# Patient Record
Sex: Female | Born: 1945 | Race: White | Hispanic: No | Marital: Single | Smoking: Former smoker
Health system: Southern US, Community
[De-identification: ages and names within clinical notes are randomized; demographics above are authoritative.]

## PROBLEM LIST (undated history)

## (undated) DIAGNOSIS — E785 Hyperlipidemia, unspecified: Secondary | ICD-10-CM

## (undated) DIAGNOSIS — I10 Essential (primary) hypertension: Secondary | ICD-10-CM

## (undated) DIAGNOSIS — E079 Disorder of thyroid, unspecified: Secondary | ICD-10-CM

## (undated) HISTORY — DX: Hyperlipidemia, unspecified: E78.5

## (undated) HISTORY — DX: Essential (primary) hypertension: I10

## (undated) HISTORY — DX: Disorder of thyroid, unspecified: E07.9

## (undated) HISTORY — PX: TONSILLECTOMY: SUR1361

---

## 2000-05-20 HISTORY — PX: COLONOSCOPY: SHX174

## 2007-03-27 HISTORY — PX: OTHER SURGICAL HISTORY: SHX169

## 2007-05-28 ENCOUNTER — Ambulatory Visit: Payer: Self-pay | Admitting: General Surgery

## 2007-05-28 ENCOUNTER — Other Ambulatory Visit: Payer: Self-pay

## 2007-05-29 ENCOUNTER — Ambulatory Visit: Payer: Self-pay | Admitting: General Surgery

## 2008-03-26 HISTORY — PX: EYE SURGERY: SHX253

## 2008-03-30 ENCOUNTER — Ambulatory Visit: Payer: Self-pay | Admitting: Orthopedic Surgery

## 2009-05-06 ENCOUNTER — Ambulatory Visit: Payer: Self-pay | Admitting: Ophthalmology

## 2009-05-16 ENCOUNTER — Ambulatory Visit: Payer: Self-pay | Admitting: Ophthalmology

## 2009-07-08 ENCOUNTER — Ambulatory Visit: Payer: Self-pay | Admitting: Ophthalmology

## 2009-07-18 ENCOUNTER — Ambulatory Visit: Payer: Self-pay | Admitting: Ophthalmology

## 2011-05-24 HISTORY — PX: COLONOSCOPY: SHX174

## 2013-06-12 ENCOUNTER — Encounter: Payer: Self-pay | Admitting: Podiatry

## 2013-06-12 ENCOUNTER — Ambulatory Visit (INDEPENDENT_AMBULATORY_CARE_PROVIDER_SITE_OTHER): Payer: Medicare Other | Admitting: Podiatry

## 2013-06-12 VITALS — BP 122/66 | HR 72 | Resp 16 | Ht 62.0 in | Wt 135.0 lb

## 2013-06-12 DIAGNOSIS — M79609 Pain in unspecified limb: Secondary | ICD-10-CM

## 2013-06-12 DIAGNOSIS — B351 Tinea unguium: Secondary | ICD-10-CM

## 2013-06-12 NOTE — Progress Notes (Signed)
   Subjective:    Patient ID: Tammy Dalton, female    DOB: [birth year], 68 y.o.   MRN: [id number]  HPI Comments: Right great toenail is raised and yellow , sore on the lateral side of toe. Its been a couple of months with no treatment      Review of Systems  HENT:       Sinus   All other systems reviewed and are negative.       Objective:   Physical Exam        Assessment & Plan:

## 2013-06-12 NOTE — Progress Notes (Signed)
Subjective:     Patient ID: Tammy Dalton, female   DOB: [birth year], 68 y.o.   MRN: [id number]  HPI patient points to right big toe stating she's concerned by some discoloration and thickness   Review of Systems  All other systems reviewed and are negative.       Objective:   Physical Exam  Nursing note and vitals reviewed. Constitutional: She is oriented to person, place, and time.  Cardiovascular: Intact distal pulses.   Musculoskeletal: Normal range of motion.  Neurological: She is oriented to person, place, and time.  Skin: Skin is warm.   neurovascular status intact with range of motion adequate subtalar midtarsal joint and muscle strength adequate. Right hallux lateral side has some lifting and yellow  with minimal discomfort     Assessment:     Probable trauma to the right hallux nail with possible mycotic infiltration    Plan:     H&P reviewed and I have recommended topical medication and leading they grow out. If symptoms were to present and progress we will need to consider nail excision

## 2013-06-18 ENCOUNTER — Ambulatory Visit: Payer: Self-pay | Admitting: Podiatry

## 2014-07-20 ENCOUNTER — Ambulatory Visit (INDEPENDENT_AMBULATORY_CARE_PROVIDER_SITE_OTHER): Payer: Medicare Other | Admitting: Podiatry

## 2014-07-20 ENCOUNTER — Ambulatory Visit (INDEPENDENT_AMBULATORY_CARE_PROVIDER_SITE_OTHER): Payer: Medicare Other

## 2014-07-20 VITALS — BP 129/63 | HR 77 | Resp 16 | Ht 62.0 in | Wt 140.0 lb

## 2014-07-20 DIAGNOSIS — M2041 Other hammer toe(s) (acquired), right foot: Secondary | ICD-10-CM

## 2014-07-20 DIAGNOSIS — L84 Corns and callosities: Secondary | ICD-10-CM

## 2014-07-20 DIAGNOSIS — L603 Nail dystrophy: Secondary | ICD-10-CM | POA: Diagnosis not present

## 2014-07-22 NOTE — Progress Notes (Signed)
Patient ID: Tammy Dalton, female   DOB: [birth year], 69 y.o.   MRN: [id number]  Subjective: 69 year old female presents the office today with complaints of a painful corn on her right foot between her fourth and fifth toes. She states the areas painful particularly with pressure in certain shoes. She denies any redness or drainage from the area. She also states that her right big toe has some discoloration and she has tried topical medication without much resolution. Denies any swelling or redness from toenail denies any pain to the toenail. Denies any systemic complaints such as fevers, chills, nausea, vomiting. No acute changes since last appointment, and no other complaints at this time.   Objective: AAO x3, NAD DP/PT pulses palpable bilaterally, CRT less than 3 seconds Protective sensation intact with Simms Weinstein monofilament, vibratory sensation intact, Achilles tendon reflex intact On the lateral aspect the right fourth digit there is a small hyperkeratotic lesion over the PIPJ. Upon debridement lesion there is no underlying ulceration, drainage or other clinical signs of infection. 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 Right distal hallux nail does appear to be dystrophic, discolored and mildly hypertrophic. There is no tenderness to palpation overlying the nail and there is no swelling erythema or drainage. Remaining nails without pathology. No areas of pinpoint bony tenderness or pain with vibratory sensation. MMT 5/5, ROM WNL. No edema, erythema, increase in warmth to bilateral lower extremities.  No open lesions or pre-ulcerative lesions.  No pain with calf compression, swelling, warmth, erythema  Assessment: 69 year old female hyperkeratotic lesion right fourth toe, right fourth toenail likely onychomycosis  Plan: -All treatment options discussed with the patient including all alternatives, risks, complications.   -Hyperkeratotic lesions are debrided without occasions/clean. Dispensed offloading pads. Discussed shoe gear modifications. -In regards the right hallux toenail discussed slightly mycotic infection. Since she has attempted topical treatment without much resolution the nail was biopsied and sent to [facility] labs for evaluation.  discussed various treatment options for onychomycosis however we will await the results of the biopsy before proceeding with further treatment.  -Follow-up after nail biopsy results are obtained or sooner if any problems are to arise.  -Patient encouraged to call the office with any questions, concerns, change in symptoms.

## 2014-07-29 ENCOUNTER — Telehealth: Payer: Self-pay | Admitting: Podiatry

## 2014-07-29 NOTE — Telephone Encounter (Signed)
Patient is scheduled with dr Jacqualyn Posey for tues may 10th

## 2014-07-29 NOTE — Telephone Encounter (Signed)
Please call about her blood work

## 2014-08-03 ENCOUNTER — Ambulatory Visit (INDEPENDENT_AMBULATORY_CARE_PROVIDER_SITE_OTHER): Payer: Medicare Other | Admitting: Podiatry

## 2014-08-03 DIAGNOSIS — B351 Tinea unguium: Secondary | ICD-10-CM

## 2014-08-03 MED ORDER — CICLOPIROX 8 % EX SOLN: Freq: Every day | CUTANEOUS | Status: DC

## 2014-08-06 ENCOUNTER — Telehealth: Payer: Self-pay | Admitting: *Deleted

## 2014-08-06 NOTE — Telephone Encounter (Signed)
Called prescription penlac into [facility] [address], patient called and stated that she had lost her prescription

## 2014-08-06 NOTE — Progress Notes (Signed)
Patient ID: Tammy Dalton, female   DOB: 17-Jan-1946, 69 y.o.   MRN: [id number]  Subjective: 69 year old female presents the office to discuss nail biopsy results. She denies any acute changes since last appointment. She states that she continues not have any pain on the toenail denies any redness or drainage. No other complaints at this time in no acute changes since last appointment.  Objective: AAO 3, NAD Neurovascular status unchanged. Right hallux nail does appear to be dystrophic, discolored and mildly hypertrophic as well as the fourth digit toenail the right foot. Remaining nails also has slight discoloration. There is no tenderness to palpation along the nails is no surrounding erythema or drainage. No other areas of tenderness to bilateral  Lower extremity's. No pain with calf compression, swelling, warmth, erythema.  Assessment: 69 year old female with onychomycosis.  Plan: -Treatment options were discussed include alternatives, risks, complications. Nail biopsy results were discussed the patient which revealed Saprophytic fungil.  At this time she is not to proceed with topical treatment and Penlac was prescribed no known may not be the best treatment for this particular fungus. Application instructions as well as side effects were discussed the patient. Follow-up as needed. Call the office with any questions, concerns, changes symptoms.

## 2016-04-18 ENCOUNTER — Encounter: Payer: Self-pay | Admitting: *Deleted

## 2016-04-19 ENCOUNTER — Ambulatory Visit (INDEPENDENT_AMBULATORY_CARE_PROVIDER_SITE_OTHER): Payer: Medicare Other | Admitting: General Surgery

## 2016-04-19 ENCOUNTER — Encounter: Payer: Self-pay | Admitting: General Surgery

## 2016-04-19 VITALS — BP 130/74 | HR 74 | Resp 12 | Ht 62.0 in | Wt 139.0 lb

## 2016-04-19 DIAGNOSIS — K625 Hemorrhage of anus and rectum: Secondary | ICD-10-CM | POA: Diagnosis not present

## 2016-04-19 LAB — POC HEMOCCULT BLD/STL (OFFICE/1-CARD/DIAGNOSTIC): FECAL OCCULT BLD: POSITIVE — AB

## 2016-04-19 MED ORDER — POLYETHYLENE GLYCOL 3350 17 GM/SCOOP PO POWD: ORAL | 0 refills | Status: DC

## 2016-04-19 NOTE — Patient Instructions (Signed)
Call back with any questions or concerns. Colonoscopy, Adult A colonoscopy is an exam to look at the entire large intestine. During the exam, a lubricated, bendable tube is inserted into the anus and then passed into the rectum, colon, and other parts of the large intestine. A colonoscopy is often done as a part of normal colorectal screening or in response to certain symptoms, such as anemia, persistent diarrhea, abdominal pain, and blood in the stool. The exam can help screen for and diagnose medical problems, including:  Tumors.  Polyps.  Inflammation.  Areas of bleeding. Tell a health care provider about:  Any allergies you have.  All medicines you are taking, including vitamins, herbs, eye drops, creams, and over-the-counter medicines.  Any problems you or family members have had with anesthetic medicines.  Any blood disorders you have.  Any surgeries you have had.  Any medical conditions you have.  Any problems you have had passing stool. What are the risks? Generally, this is a safe procedure. However, problems may occur, including:  Bleeding.  A tear in the intestine.  A reaction to medicines given during the exam.  Infection (rare). What happens before the procedure? Eating and drinking restrictions  Follow instructions from your health care provider about eating and drinking, which may include:  A few days before the procedure - follow a low-fiber diet. Avoid nuts, seeds, dried fruit, raw fruits, and vegetables.  1-3 days before the procedure - follow a clear liquid diet. Drink only clear liquids, such as clear broth or bouillon, black coffee or tea, clear juice, clear soft drinks or sports drinks, gelatin desert, and popsicles. Avoid any liquids that contain red or purple dye.  On the day of the procedure - do not eat or drink anything during the 2 hours before the procedure, or within the time period that your health care provider recommends. Bowel prep  If  you were prescribed an oral bowel prep to clean out your colon:  Take it as told by your health care provider. Starting the day before your procedure, you will need to drink a large amount of medicated liquid. The liquid will cause you to have multiple loose stools until your stool is almost clear or light green.  If your skin or anus gets irritated from diarrhea, you may use these to relieve the irritation:  Medicated wipes, such as adult wet wipes with aloe and vitamin E.  A skin soothing-product like petroleum jelly.  If you vomit while drinking the bowel prep, take a break for up to 60 minutes and then begin the bowel prep again. If vomiting continues and you cannot take the bowel prep without vomiting, call your health care provider. General instructions  Ask your health care provider about changing or stopping your regular medicines. This is especially important if you are taking diabetes medicines or blood thinners.  Plan to have someone take you home from the hospital or clinic. What happens during the procedure?  An IV tube may be inserted into one of your veins.  You will be given medicine to help you relax (sedative).  To reduce your risk of infection:  Your health care team will wash or sanitize their hands.  Your anal area will be washed with soap.  You will be asked to lie on your side with your knees bent.  Your health care provider will lubricate a long, thin, flexible tube. The tube will have a camera and a light on the end.  The tube  will be inserted into your anus.  The tube will be gently eased through your rectum and colon.  Air will be delivered into your colon to keep it open. You may feel some pressure or cramping.  The camera will be used to take images during the procedure.  A small tissue sample may be removed from your body to be examined under a microscope (biopsy). If any potential problems are found, the tissue will be sent to a lab for  testing.  If small polyps are found, your health care provider may remove them and have them checked for cancer cells.  The tube that was inserted into your anus will be slowly removed. The procedure may vary among health care providers and hospitals. What happens after the procedure?  Your blood pressure, heart rate, breathing rate, and blood oxygen level will be monitored until the medicines you were given have worn off.  Do not drive for 24 hours after the exam.  You may have a small amount of blood in your stool.  You may pass gas and have mild abdominal cramping or bloating due to the air that was used to inflate your colon during the exam.  It is up to you to get the results of your procedure. Ask your health care provider, or the department performing the procedure, when your results will be ready. This information is not intended to replace advice given to you by your health care provider. Make sure you discuss any questions you have with your health care provider. Document Released: 03/09/2000 Document Revised: 09/30/2015 Document Reviewed: 05/24/2015 Elsevier Interactive Patient Education  2017 [employer].

## 2016-04-19 NOTE — Progress Notes (Signed)
Patient ID: Tammy Dalton, female   DOB: 09-10-1945, 71 y.o.   MRN: [id number]  Chief Complaint  Patient presents with  . Other    HPI Tammy Dalton is a 71 y.o. female here today for evaluation of rectal bleeding. She noticed rectal blooding on Tuesday . Bright red blood in the bowel and on paper. Noted that she had one episode of diarrhea on Monday. Denies pain, fever, N/V.  I have reviewed the history of present illness with the patient.    HPI  Past Medical History:  Diagnosis Date  . Hyperlipidemia   . Hypertension   . Thyroid disease     Past Surgical History:  Procedure Laterality Date  . ANAL FISTULOTOMY  2009  . COLONOSCOPY  05/24/2011   Dr. Jill Side done at [facility]  . COLONOSCOPY  05/20/2000   Dr. Jamal Collin; Sharp bend at 50 cm. Scope could not be advanced any further even with glucagon.  . TONSILLECTOMY      No family history on file.  Social History Social History  Substance Use Topics  . Smoking status: Former Smoker    Types: Cigarettes  . Smokeless tobacco: Never Used  . Alcohol use No    No Known Allergies  Current Outpatient Prescriptions  Medication Sig Dispense Refill  . cholecalciferol (VITAMIN D) 1000 units tablet Take 1,000 Units by mouth daily.    Marland Kitchen levothyroxine (SYNTHROID, LEVOTHROID) 112 MCG tablet     . lisinopril-hydrochlorothiazide (PRINZIDE,ZESTORETIC) 20-25 MG per tablet     . Omega-3 Fatty Acids (FISH OIL) 1200 MG CAPS Take by mouth.    . simvastatin (ZOCOR) 40 MG tablet     . zolpidem (AMBIEN) 10 MG tablet     . polyethylene glycol powder (GLYCOLAX/MIRALAX) powder 255 grams one bottle for colonoscopy prep 255 g 0   No current facility-administered medications for this visit.     Review of Systems Review of Systems  Blood pressure 130/74, pulse 74, resp. rate 12, height 5\' 2"  (1.575 m), weight 139 lb (63 kg).  Physical Exam Physical Exam  Constitutional: She is oriented to person, place,  and time. She appears well-developed and well-nourished.  Eyes: Conjunctivae are normal. No scleral icterus.  Neck: Neck supple.  Cardiovascular: Normal rate, regular rhythm and normal heart sounds.   Pulmonary/Chest: Effort normal and breath sounds normal.  Abdominal: Soft. Bowel sounds are normal. There is no tenderness.  Genitourinary: Rectum normal.     Lymphadenopathy:    She has no cervical adenopathy.  Neurological: She is alert and oriented to person, place, and time.  Skin: Skin is warm and dry.  Psychiatric: She has a normal mood and affect.    Data Reviewed Prior notes reviewed.  Assessment    Rectal bleeding-possibly diverticular    Plan    Schedule colonoscopy.  Colonoscopy with possible biopsy/polypectomy prn: Information regarding the procedure, including its potential risks and complications (including but not limited to perforation of the bowel, which may require emergency surgery to repair, and bleeding) was verbally given to the patient. Educational information regarding lower intestinal endoscopy was given to the patient. Written instructions for how to complete the bowel prep using Miralax were provided. The importance of drinking ample fluids to avoid dehydration as a result of the prep emphasized.    Patient has been scheduled for a colonoscopy on 04-24-16 at [facility]. This patient is aware to go ahead and discontinue her fish oil.  This information has been scribed by  Gaspar Cola CMA.   SANKAR,SEEPLAPUTHUR G 04/23/2016, 8:37 AM

## 2016-04-23 ENCOUNTER — Encounter: Payer: Self-pay | Admitting: General Surgery

## 2016-04-24 ENCOUNTER — Ambulatory Visit: Payer: Medicare Other | Admitting: Anesthesiology

## 2016-04-24 ENCOUNTER — Encounter: Payer: Self-pay | Admitting: *Deleted

## 2016-04-24 ENCOUNTER — Ambulatory Visit
Admission: RE | Admit: 2016-04-24 | Discharge: 2016-04-24 | Disposition: A | Discharge status: Home / Self Care | Payer: Medicare Other | Source: Ambulatory Visit | Attending: General Surgery | Admitting: General Surgery

## 2016-04-24 ENCOUNTER — Encounter
Admission: RE | Disposition: A | Discharge status: Home / Self Care | Payer: Self-pay | Source: Ambulatory Visit | Attending: General Surgery

## 2016-04-24 DIAGNOSIS — Z79899 Other long term (current) drug therapy: Secondary | ICD-10-CM | POA: Diagnosis not present

## 2016-04-24 DIAGNOSIS — I10 Essential (primary) hypertension: Secondary | ICD-10-CM | POA: Diagnosis not present

## 2016-04-24 DIAGNOSIS — Z87891 Personal history of nicotine dependence: Secondary | ICD-10-CM | POA: Insufficient documentation

## 2016-04-24 DIAGNOSIS — E039 Hypothyroidism, unspecified: Secondary | ICD-10-CM | POA: Insufficient documentation

## 2016-04-24 DIAGNOSIS — K625 Hemorrhage of anus and rectum: Secondary | ICD-10-CM | POA: Diagnosis not present

## 2016-04-24 DIAGNOSIS — E785 Hyperlipidemia, unspecified: Secondary | ICD-10-CM | POA: Diagnosis not present

## 2016-04-24 HISTORY — PX: COLONOSCOPY WITH PROPOFOL: SHX5780

## 2016-04-24 SURGERY — COLONOSCOPY WITH PROPOFOL
Anesthesia: General

## 2016-04-24 MED ORDER — LIDOCAINE HCL (PF) 2 % IJ SOLN
INTRAMUSCULAR | Status: AC
  Filled 2016-04-24: qty 2

## 2016-04-24 MED ORDER — LIDOCAINE HCL (CARDIAC) 20 MG/ML IV SOLN
INTRAVENOUS | Status: DC | PRN
  Administered 2016-04-24: 60 mg via INTRAVENOUS

## 2016-04-24 MED ORDER — PROPOFOL 10 MG/ML IV BOLUS
INTRAVENOUS | Status: DC | PRN
  Administered 2016-04-24: 50 mg via INTRAVENOUS

## 2016-04-24 MED ORDER — PROPOFOL 500 MG/50ML IV EMUL
INTRAVENOUS | Status: AC
  Filled 2016-04-24: qty 50

## 2016-04-24 MED ORDER — PROPOFOL 500 MG/50ML IV EMUL
INTRAVENOUS | Status: DC | PRN
  Administered 2016-04-24: 150 ug/kg/min via INTRAVENOUS

## 2016-04-24 MED ORDER — SODIUM CHLORIDE 0.9 % IV SOLN
INTRAVENOUS | Status: DC | PRN
  Administered 2016-04-24: 16:00:00 via INTRAVENOUS

## 2016-04-24 NOTE — Anesthesia Preprocedure Evaluation (Signed)
Anesthesia Evaluation  Patient identified by MRN, date of birth, ID band Patient awake    Reviewed: Allergy & Precautions, NPO status , Patient's Chart, lab work & pertinent test results  History of Anesthesia Complications Negative for: history of anesthetic complications  Airway Mallampati: III       Dental   Pulmonary neg pulmonary ROS, former smoker,           Cardiovascular hypertension, Pt. on medications      Neuro/Psych negative neurological ROS     GI/Hepatic negative GI ROS, Neg liver ROS,   Endo/Other  Hypothyroidism   Renal/GU negative Renal ROS     Musculoskeletal   Abdominal   Peds  Hematology negative hematology ROS (+)   Anesthesia Other Findings   Reproductive/Obstetrics                             Anesthesia Physical Anesthesia Plan  ASA: II  Anesthesia Plan: General   Post-op Pain Management:    Induction: Intravenous  Airway Management Planned: Nasal Cannula  Additional Equipment:   Intra-op Plan:   Post-operative Plan:   Informed Consent: I have reviewed the patients History and Physical, chart, labs and discussed the procedure including the risks, benefits and alternatives for the proposed anesthesia with the patient or authorized representative who has indicated his/her understanding and acceptance.     Plan Discussed with:   Anesthesia Plan Comments:         Anesthesia Quick Evaluation

## 2016-04-24 NOTE — Interval H&P Note (Signed)
History and Physical Interval Note:  04/24/2016 3:32 PM  Golden Pop  has presented today for surgery, with the diagnosis of RECTAL BLEED  The various methods of treatment have been discussed with the patient and family. After consideration of risks, benefits and other options for treatment, the patient has consented to  Procedure(s): COLONOSCOPY WITH PROPOFOL (N/A) as a surgical intervention .  The patient's history has been reviewed, patient examined, no change in status, stable for surgery.  I have reviewed the patient's chart and labs.  Questions were answered to the patient's satisfaction.     SANKAR,SEEPLAPUTHUR G

## 2016-04-24 NOTE — Anesthesia Postprocedure Evaluation (Signed)
Anesthesia Post Note  Patient: Tammy Dalton  Procedure(s) Performed: Procedure(s) (LRB): COLONOSCOPY WITH PROPOFOL (N/A)  Patient location during evaluation: Endoscopy Anesthesia Type: General Level of consciousness: awake and alert Pain management: pain level controlled Vital Signs Assessment: post-procedure vital signs reviewed and stable Respiratory status: spontaneous breathing and respiratory function stable Cardiovascular status: stable Anesthetic complications: no     Last Vitals:  Vitals:   04/24/16 1505 04/24/16 1650  BP: 122/63 120/66  Pulse: 82   Resp: 16 16  Temp: 36.3 C (!) 35.7 C    Last Pain:  Vitals:   04/24/16 1650  TempSrc: Tympanic                 KEPHART,WILLIAM K

## 2016-04-24 NOTE — H&P (View-Only) (Signed)
Patient ID: Tammy Dalton, female   DOB: [birth year], 71 y.o.   MRN: [id number]  Chief Complaint  Patient presents with  . Other    HPI Tammy Dalton is a 71 y.o. female here today for evaluation of rectal bleeding. She noticed rectal blooding on Tuesday . Bright red blood in the bowel and on paper. Noted that she had one episode of diarrhea on Monday. Denies pain, fever, N/V.  I have reviewed the history of present illness with the patient.    HPI  Past Medical History:  Diagnosis Date  . Hyperlipidemia   . Hypertension   . Thyroid disease     Past Surgical History:  Procedure Laterality Date  . ANAL FISTULOTOMY  2009  . COLONOSCOPY  05/24/2011   Dr. Jill Side done at [facility]  . COLONOSCOPY  05/20/2000   Dr. Jamal Collin; Sharp bend at 50 cm. Scope could not be advanced any further even with glucagon.  . TONSILLECTOMY      No family history on file.  Social History Social History  Substance Use Topics  . Smoking status: Former Smoker    Types: Cigarettes  . Smokeless tobacco: Never Used  . Alcohol use No    No Known Allergies  Current Outpatient Prescriptions  Medication Sig Dispense Refill  . cholecalciferol (VITAMIN D) 1000 units tablet Take 1,000 Units by mouth daily.    Marland Kitchen levothyroxine (SYNTHROID, LEVOTHROID) 112 MCG tablet     . lisinopril-hydrochlorothiazide (PRINZIDE,ZESTORETIC) 20-25 MG per tablet     . Omega-3 Fatty Acids (FISH OIL) 1200 MG CAPS Take by mouth.    . simvastatin (ZOCOR) 40 MG tablet     . zolpidem (AMBIEN) 10 MG tablet     . polyethylene glycol powder (GLYCOLAX/MIRALAX) powder 255 grams one bottle for colonoscopy prep 255 g 0   No current facility-administered medications for this visit.     Review of Systems Review of Systems  Blood pressure 130/74, pulse 74, resp. rate 12, height 5\' 2"  (1.575 m), weight 139 lb (63 kg).  Physical Exam Physical Exam  Constitutional: She is oriented to person, place,  and time. She appears well-developed and well-nourished.  Eyes: Conjunctivae are normal. No scleral icterus.  Neck: Neck supple.  Cardiovascular: Normal rate, regular rhythm and normal heart sounds.   Pulmonary/Chest: Effort normal and breath sounds normal.  Abdominal: Soft. Bowel sounds are normal. There is no tenderness.  Genitourinary: Rectum normal.     Lymphadenopathy:    She has no cervical adenopathy.  Neurological: She is alert and oriented to person, place, and time.  Skin: Skin is warm and dry.  Psychiatric: She has a normal mood and affect.    Data Reviewed Prior notes reviewed.  Assessment    Rectal bleeding-possibly diverticular    Plan    Schedule colonoscopy.  Colonoscopy with possible biopsy/polypectomy prn: Information regarding the procedure, including its potential risks and complications (including but not limited to perforation of the bowel, which may require emergency surgery to repair, and bleeding) was verbally given to the patient. Educational information regarding lower intestinal endoscopy was given to the patient. Written instructions for how to complete the bowel prep using Miralax were provided. The importance of drinking ample fluids to avoid dehydration as a result of the prep emphasized.    Patient has been scheduled for a colonoscopy on 04-24-16 at [facility]. This patient is aware to go ahead and discontinue her fish oil.  This information has been scribed by  Gaspar Cola CMA.   Nelsie Domino G 04/23/2016, 8:37 AM

## 2016-04-24 NOTE — Anesthesia Post-op Follow-up Note (Cosign Needed)
Anesthesia QCDR form completed.        

## 2016-04-24 NOTE — Op Note (Signed)
[facility]enter[facility] Patient Name: Tammy Dalton Procedure Date: 04/24/2016 4:19 PM MRN: [id number] Account #: [id number] Date of Birth: 25-Jul-1945 Admit Type: Outpatient Age: 71 Room: [facility] ENDO ROOM 4 Gender: Female Note Status: Finalized Procedure:            Colonoscopy Indications:          Rectal bleeding Providers:            Seeplaputhur G. Jamal Collin, MD Referring MD:         Randall Hiss g. Caryl Bis (Referring MD) Medicines:            General Anesthesia Complications:        No immediate complications. Procedure:            Pre-Anesthesia Assessment:                       - General anesthesia under the supervision of an                        anesthesiologist was determined to be medically                        necessary for this procedure based on review of the                        patient's medical history, medications, and prior                        anesthesia history.                       After obtaining informed consent, the colonoscope was                        passed under direct vision. Throughout the procedure,                        the patient's blood pressure, pulse, and oxygen                        saturations were monitored continuously. The                        Colonoscope was introduced through the anus and                        advanced to the the cecum, identified by the ileocecal                        valve. The colonoscopy was performed without                        difficulty. The patient tolerated the procedure well.                        The quality of the bowel preparation was good. Findings:      The perianal and digital rectal examinations were normal.      The entire examined colon appeared normal on direct and retroflexion       views. Impression:           - The entire examined colon is  normal on direct and                        retroflexion views.                       - No specimens  collected. Recommendation:       - Discharge patient to home.                       - Resume previous diet.                       - Continue present medications.                       - Return to my office in 2 weeks. Procedure Code(s):    --- Professional ---                       [phone], Colonoscopy, flexible; diagnostic, including                        collection of specimen(s) by brushing or washing, when                        performed (separate procedure) Diagnosis Code(s):    --- Professional ---                       K62.5, Hemorrhage of anus and rectum CPT copyright 2016 [employer]. All rights reserved. The codes documented in this report are preliminary and upon coder review may  be revised to meet current compliance requirements. Christene Lye, MD 04/24/2016 4:52:07 PM This report has been signed electronically. Number of Addenda: 0 Note Initiated On: 04/24/2016 4:19 PM Scope Withdrawal Time: 0 hours 8 minutes 33 seconds  Total Procedure Duration: 0 hours 22 minutes 5 seconds       [facility]

## 2016-04-24 NOTE — Transfer of Care (Signed)
Immediate Anesthesia Transfer of Care Note  Patient: Tammy Dalton  Procedure(s) Performed: Procedure(s): COLONOSCOPY WITH PROPOFOL (N/A)  Patient Location: Endoscopy Unit  Anesthesia Type:General  Level of Consciousness: awake, oriented and patient cooperative  Airway & Oxygen Therapy: Patient Spontanous Breathing and Patient connected to nasal cannula oxygen  Post-op Assessment: Report given to RN, Post -op Vital signs reviewed and stable and Patient moving all extremities X 4  Post vital signs: Reviewed and stable  Last Vitals:  Vitals:   04/24/16 1505  BP: 122/63  Pulse: 82  Resp: 16  Temp: 36.3 C    Last Pain:  Vitals:   04/24/16 1505  TempSrc: Tympanic         Complications: No apparent anesthesia complications

## 2016-04-25 ENCOUNTER — Encounter: Payer: Self-pay | Admitting: General Surgery

## 2016-05-08 ENCOUNTER — Encounter: Payer: Self-pay | Admitting: General Surgery

## 2016-05-08 ENCOUNTER — Ambulatory Visit (INDEPENDENT_AMBULATORY_CARE_PROVIDER_SITE_OTHER): Payer: Medicare Other | Admitting: General Surgery

## 2016-05-08 VITALS — BP 140/72 | HR 68 | Resp 12 | Ht 62.0 in | Wt 144.0 lb

## 2016-05-08 DIAGNOSIS — K625 Hemorrhage of anus and rectum: Secondary | ICD-10-CM

## 2016-05-08 NOTE — Patient Instructions (Signed)
Return as needed

## 2016-05-08 NOTE — Progress Notes (Signed)
Patient ID: Tammy Dalton, female   DOB: [birth year], 71 y.o.   MRN: [id number]  Chief Complaint  Patient presents with  . Colonoscopy    HPI Tammy Dalton is a 71 y.o. female here today for her follow up from an colonoscopy done on 04/24/2016. Patient states she is doing well.  I have reviewed the history of present illness with the patient.  HPI  Past Medical History:  Diagnosis Date  . Hyperlipidemia   . Hypertension   . Thyroid disease     Past Surgical History:  Procedure Laterality Date  . anal sphincterotomy and fissurectomy  2009  . COLONOSCOPY  05/24/2011   Dr. Jill Side done at [facility]  . COLONOSCOPY  05/20/2000   Dr. Jamal Collin; Sharp bend at 50 cm. Scope could not be advanced any further even with glucagon.  . COLONOSCOPY WITH PROPOFOL N/A 04/24/2016   Procedure: COLONOSCOPY WITH PROPOFOL;  Surgeon: Christene Lye, MD;  Location: [facility] ENDOSCOPY;  Service: Endoscopy;  Laterality: N/A;  . EYE SURGERY Bilateral 2010   cataract surgery.   . TONSILLECTOMY      No family history on file.  Social History Social History  Substance Use Topics  . Smoking status: Former Smoker    Types: Cigarettes  . Smokeless tobacco: Never Used  . Alcohol use No    No Known Allergies  Current Outpatient Prescriptions  Medication Sig Dispense Refill  . cholecalciferol (VITAMIN D) 1000 units tablet Take 1,000 Units by mouth daily.    Marland Kitchen levothyroxine (SYNTHROID, LEVOTHROID) 112 MCG tablet     . lisinopril-hydrochlorothiazide (PRINZIDE,ZESTORETIC) 20-25 MG per tablet     . Omega-3 Fatty Acids (FISH OIL) 1200 MG CAPS Take by mouth.    . polyethylene glycol powder (GLYCOLAX/MIRALAX) powder 255 grams one bottle for colonoscopy prep 255 g 0  . simvastatin (ZOCOR) 40 MG tablet     . zolpidem (AMBIEN) 10 MG tablet      No current facility-administered medications for this visit.     Review of Systems Review of Systems  Blood pressure 140/72,  pulse 68, resp. rate 12, height 5\' 2"  (1.575 m), weight 144 lb (65.3 kg).  Physical Exam Physical Exam Not done. Here for discussion. Data Reviewed   Assessment   Pt here for discussion regarding colonoscopy findings. Pt did have bleeding documented on exam prior to colonoscopy. However colonoscopy was entirely normal. Pt advised that if she has recurrence of bleeding she should seek attention again. It is possible she had few diverticulae whiche were not visible at colonoscopy. Other causes of bleeding explained to her.     Plan    Return as needed. This information has been scribed by Gaspar Cola CMA.        Andros Channing G 05/10/2016, 9:05 AM

## 2016-05-31 ENCOUNTER — Telehealth: Payer: Self-pay | Admitting: Family Medicine

## 2016-05-31 NOTE — Telephone Encounter (Signed)
Pt called and stated that you can go ahead and send over the release of medical information to her other doctor. She was able to get her refills.

## 2016-06-04 ENCOUNTER — Encounter: Payer: Self-pay | Admitting: General Surgery

## 2016-06-05 ENCOUNTER — Encounter: Payer: Self-pay | Admitting: General Surgery

## 2016-06-20 ENCOUNTER — Ambulatory Visit (INDEPENDENT_AMBULATORY_CARE_PROVIDER_SITE_OTHER): Payer: Medicare Other | Admitting: Family Medicine

## 2016-06-20 ENCOUNTER — Encounter: Payer: Self-pay | Admitting: Family Medicine

## 2016-06-20 VITALS — BP 128/80 | HR 71 | Temp 98.5°F | Ht 62.0 in | Wt 142.6 lb

## 2016-06-20 DIAGNOSIS — Z1231 Encounter for screening mammogram for malignant neoplasm of breast: Secondary | ICD-10-CM | POA: Diagnosis not present

## 2016-06-20 DIAGNOSIS — E039 Hypothyroidism, unspecified: Secondary | ICD-10-CM | POA: Diagnosis not present

## 2016-06-20 DIAGNOSIS — Z1159 Encounter for screening for other viral diseases: Secondary | ICD-10-CM | POA: Diagnosis not present

## 2016-06-20 DIAGNOSIS — Z1239 Encounter for other screening for malignant neoplasm of breast: Secondary | ICD-10-CM

## 2016-06-20 DIAGNOSIS — C4490 Unspecified malignant neoplasm of skin, unspecified: Secondary | ICD-10-CM | POA: Insufficient documentation

## 2016-06-20 DIAGNOSIS — G479 Sleep disorder, unspecified: Secondary | ICD-10-CM | POA: Insufficient documentation

## 2016-06-20 DIAGNOSIS — I10 Essential (primary) hypertension: Secondary | ICD-10-CM | POA: Diagnosis not present

## 2016-06-20 DIAGNOSIS — E785 Hyperlipidemia, unspecified: Secondary | ICD-10-CM | POA: Diagnosis not present

## 2016-06-20 NOTE — Progress Notes (Signed)
Pre visit review using our clinic review tool, if applicable. No additional management support is needed unless otherwise documented below in the visit note. 

## 2016-06-20 NOTE — Assessment & Plan Note (Signed)
Stable on Ambien. No adverse effects from this. She'll continue this medication.

## 2016-06-20 NOTE — Progress Notes (Signed)
[name], MD Phone: [phone]  Tammy Dalton is a 71 y.o. female who presents today for new patient visit.   HYPERLIPIDEMIA Symptoms Chest pain on exertion:  no   Medications: Compliance- taking simvastatin and fish oil Right upper quadrant pain- no  Muscle aches- no  HYPERTENSION  Disease Monitoring  Home BP Monitoring 125/78 at [address] Chest pain- no    Dyspnea- no Medications  Compliance-  Taking lisinopril/HCTZ. Lightheadedness-  no  Edema- no  HYPOTHYROIDISM Disease Monitoring Weight changes: no  Skin Changes: no Palpitations: no Heat/Cold intolerance: no  Medication Monitoring Compliance:  Taking synthroid   Last TSH:  No results found for: TSH  Patient notes no new skin lesions. She follows with Dr. Evorn Gong for skin checks given prior skin cancers.  Sleep difficulty: Takes Ambien 5 mg nightly. This does help her sleep. She notes no drowsiness with this. No adverse effects from it.   Active Ambulatory Problems    Diagnosis Date Noted  . Hyperlipidemia 06/20/2016  . Hypertension 06/20/2016  . Hypothyroidism 06/20/2016  . Sleeping difficulty 06/20/2016  . Skin cancer 06/20/2016   Resolved Ambulatory Problems    Diagnosis Date Noted  . No Resolved Ambulatory Problems   Past Medical History:  Diagnosis Date  . Hyperlipidemia   . Hypertension   . Thyroid disease     Family History  Problem Relation Age of Onset  . Depression Mother   . Heart disease Father   . Early death Sister   . Alcohol abuse Brother   . Stroke Paternal Grandfather     Social History   Social History  . Marital status: Single    Spouse name: N/A  . Number of children: N/A  . Years of education: N/A   Occupational History  . Not on file.   Social History Main Topics  . Smoking status: Former Smoker    Types: Cigarettes  . Smokeless tobacco: Never Used  . Alcohol use No  . Drug use: No  . Sexual activity: Not on file   Other Topics Concern  .  Not on file   Social History Narrative  . No narrative on file    ROS  General:  Negative for nexplained weight loss, fever Skin: Negative for new or changing mole, sore that won't heal HEENT: Negative for trouble hearing, trouble seeing, ringing in ears, mouth sores, hoarseness, change in voice, dysphagia. CV:  Negative for chest pain, dyspnea, edema, palpitations Resp: Negative for cough, dyspnea, hemoptysis GI: Negative for nausea, vomiting, diarrhea, constipation, abdominal pain, melena, hematochezia. GU: Negative for dysuria, incontinence, urinary hesitance, hematuria, vaginal or penile discharge, polyuria, sexual difficulty, lumps in testicle or breasts MSK: Negative for muscle cramps or aches, joint pain or swelling Neuro: Negative for headaches, weakness, numbness, dizziness, passing out/fainting Psych: Negative for depression, anxiety, memory problems  Objective  Physical Exam Vitals:   06/20/16 1525  BP: 128/80  Pulse: 71  Temp: 98.5 F (36.9 C)    BP Readings from Last 3 Encounters:  06/20/16 128/80  05/08/16 140/72  04/24/16 (!) 121/59   Wt Readings from Last 3 Encounters:  06/20/16 142 lb 9.6 oz (64.7 kg)  05/08/16 144 lb (65.3 kg)  04/24/16 140 lb (63.5 kg)    Physical Exam  Constitutional: No distress.  HENT:  Head: Normocephalic and atraumatic.  Mouth/Throat: Oropharynx is clear and moist. No oropharyngeal exudate.  Eyes: Conjunctivae are normal. Pupils are equal, round, and reactive to light.  Cardiovascular: Normal rate,  regular rhythm and normal heart sounds.   Pulmonary/Chest: Effort normal and breath sounds normal.  Abdominal: Soft. Bowel sounds are normal. She exhibits no distension. There is no tenderness.  Musculoskeletal: She exhibits no edema.  Neurological: She is alert. Gait normal.  Skin: Skin is warm and dry. She is not diaphoretic.  Psychiatric: Mood and affect normal.     Assessment/Plan:   Hyperlipidemia Continue current  medication. She'll return for lipid panel.  Hypertension Well-controlled. Will continue current medications. She will return for lab work.  Hypothyroidism Continue new current medication. Return for TSH.  Sleeping difficulty Stable on Ambien. No adverse effects from this. She'll continue this medication.  Skin cancer History of this. Followed by dermatology.   Orders Placed This Encounter  Procedures  . MM SCREENING BREAST TOMO BILATERAL    Standing Status:   Future    Standing Expiration Date:   08/20/2017    Order Specific Question:   Reason for Exam (SYMPTOM  OR DIAGNOSIS REQUIRED)    Answer:   breast cancer screening    Order Specific Question:   Preferred imaging location?    Answer:   [facility]  . TSH    Standing Status:   Future    Standing Expiration Date:   06/20/2017  . Comp Met (CMET)    Standing Status:   Future    Standing Expiration Date:   06/20/2017  . HgB A1c    Standing Status:   Future    Standing Expiration Date:   06/20/2017  . Hepatitis C Antibody    Standing Status:   Future    Standing Expiration Date:   06/20/2017  . Lipid Profile    Standing Status:   Future    Standing Expiration Date:   06/20/2017    Eric Sonnenberg, MD [facility] [place]  

## 2016-06-20 NOTE — Assessment & Plan Note (Signed)
Continue current medication. She'll return for lipid panel.

## 2016-06-20 NOTE — Assessment & Plan Note (Signed)
Well-controlled. Will continue current medications. She will return for lab work.

## 2016-06-20 NOTE — Assessment & Plan Note (Signed)
History of this. Followed by dermatology.

## 2016-06-20 NOTE — Assessment & Plan Note (Addendum)
Continue new current medication. Return for TSH.

## 2016-06-20 NOTE — Patient Instructions (Signed)
Nice to meet you. We will have you return in the next 1-2 weeks for fasting lab work. We'll get you set up for mammogram. I will see you in 6 months.

## 2016-07-02 ENCOUNTER — Telehealth: Payer: Self-pay | Admitting: Family Medicine

## 2016-07-02 NOTE — Telephone Encounter (Signed)
Last seen 06/20/16 all filed under historical

## 2016-07-02 NOTE — Telephone Encounter (Signed)
Pt need refills on the following for day days sent to [place] -zolpidem (AMBIEN) 10 MG tablet -levothyroxine (SYNTHROID, LEVOTHROID) 112 MCG tablet -lisinopril-hydrochlorothiazide (PRINZIDE,ZESTORETIC) 20-25  -simvastatin (ZOCOR) 40 MG tablet Please advise

## 2016-07-03 NOTE — Telephone Encounter (Signed)
Patient was to have labs done. Please see if these have been scheduled. They need to be scheduled prior to refills being given.

## 2016-07-04 MED ORDER — LEVOTHYROXINE SODIUM 112 MCG PO TABS: 112.0000 ug | ORAL_TABLET | Freq: Every day | ORAL | 3 refills | Status: DC

## 2016-07-04 MED ORDER — SIMVASTATIN 40 MG PO TABS: 40.0000 mg | ORAL_TABLET | Freq: Every day | ORAL | 1 refills | Status: DC

## 2016-07-04 MED ORDER — ZOLPIDEM TARTRATE 5 MG PO TABS: 5.0000 mg | ORAL_TABLET | Freq: Every evening | ORAL | 3 refills | Status: DC | PRN

## 2016-07-04 MED ORDER — LISINOPRIL-HYDROCHLOROTHIAZIDE 20-25 MG PO TABS: 1.0000 | ORAL_TABLET | Freq: Every day | ORAL | 1 refills | Status: DC

## 2016-07-04 NOTE — Telephone Encounter (Signed)
faxed

## 2016-07-04 NOTE — Telephone Encounter (Signed)
Scheduled for next week

## 2016-07-04 NOTE — Telephone Encounter (Signed)
Please fax Ambien. Others sent to pharmacy.

## 2016-07-09 ENCOUNTER — Telehealth: Payer: Self-pay | Admitting: *Deleted

## 2016-07-09 MED ORDER — LEVOTHYROXINE SODIUM 100 MCG PO TABS: 100.0000 ug | ORAL_TABLET | Freq: Every day | ORAL | 3 refills | Status: DC

## 2016-07-09 NOTE — Telephone Encounter (Signed)
Pharmacy called and stated that they she take 100 mcg's of the levothyroxine. Pharmacy has her been taking this since 2016. Can we please resend it over for the 100 mcg's. Pt does not have any idea where anyone had gotten the 112 mcg's from.  [place], [place]  Call pt @ [phone]

## 2016-07-09 NOTE — Telephone Encounter (Signed)
Patient requested a medication refill for levothyroxine  Pharmacy [facility] [address]

## 2016-07-09 NOTE — Telephone Encounter (Signed)
Patient notified

## 2016-07-09 NOTE — Telephone Encounter (Signed)
Sent to pharmacy 

## 2016-07-09 NOTE — Telephone Encounter (Signed)
Please advise 

## 2016-07-09 NOTE — Telephone Encounter (Signed)
Patient notified rx was sent ot [facility] 07/04/16

## 2016-07-10 ENCOUNTER — Other Ambulatory Visit (INDEPENDENT_AMBULATORY_CARE_PROVIDER_SITE_OTHER): Payer: Medicare Other

## 2016-07-10 DIAGNOSIS — I10 Essential (primary) hypertension: Secondary | ICD-10-CM | POA: Diagnosis not present

## 2016-07-10 DIAGNOSIS — E039 Hypothyroidism, unspecified: Secondary | ICD-10-CM | POA: Diagnosis not present

## 2016-07-10 DIAGNOSIS — Z1159 Encounter for screening for other viral diseases: Secondary | ICD-10-CM

## 2016-07-10 DIAGNOSIS — E785 Hyperlipidemia, unspecified: Secondary | ICD-10-CM

## 2016-07-10 LAB — COMPREHENSIVE METABOLIC PANEL
ALT: 23 U/L (ref 0–35)
AST: 22 U/L (ref 0–37)
Albumin: 4.7 g/dL (ref 3.5–5.2)
Alkaline Phosphatase: 82 U/L (ref 39–117)
BILIRUBIN TOTAL: 0.5 mg/dL (ref 0.2–1.2)
BUN: 23 mg/dL (ref 6–23)
CHLORIDE: 103 meq/L (ref 96–112)
CO2: 30 meq/L (ref 19–32)
Calcium: 9.9 mg/dL (ref 8.4–10.5)
Creatinine, Ser: 1.18 mg/dL (ref 0.40–1.20)
GFR: 48.18 mL/min — AB (ref >60.00)
GLUCOSE: 107 mg/dL — AB (ref 70–99)
Potassium: 3.8 mEq/L (ref 3.5–5.1)
Sodium: 141 mEq/L (ref 135–145)
Total Protein: 7.3 g/dL (ref 6.0–8.3)

## 2016-07-10 LAB — TSH: TSH: 1.69 u[IU]/mL (ref 0.35–4.50)

## 2016-07-10 LAB — LIPID PANEL
Cholesterol: 204 mg/dL — ABNORMAL HIGH (ref 0–200)
HDL: 50.3 mg/dL (ref >39.00)
LDL CALC: 126 mg/dL — AB (ref 0–99)
NONHDL: 153.83
Total CHOL/HDL Ratio: 4
Triglycerides: 141 mg/dL (ref 0.0–149.0)
VLDL: 28.2 mg/dL (ref 0.0–40.0)

## 2016-07-10 LAB — HEMOGLOBIN A1C: Hgb A1c MFr Bld: 5.6 % (ref 4.6–6.5)

## 2016-07-11 LAB — HEPATITIS C ANTIBODY: HCV AB: NEGATIVE

## 2016-07-17 DIAGNOSIS — Z1231 Encounter for screening mammogram for malignant neoplasm of breast: Secondary | ICD-10-CM | POA: Diagnosis not present

## 2016-07-17 LAB — HM MAMMOGRAPHY

## 2016-07-18 ENCOUNTER — Encounter: Payer: Self-pay | Admitting: Family Medicine

## 2016-07-25 DIAGNOSIS — M25512 Pain in left shoulder: Secondary | ICD-10-CM | POA: Diagnosis not present

## 2016-07-25 DIAGNOSIS — M7522 Bicipital tendinitis, left shoulder: Secondary | ICD-10-CM | POA: Diagnosis not present

## 2016-08-02 ENCOUNTER — Telehealth: Payer: Self-pay | Admitting: *Deleted

## 2016-08-02 NOTE — Telephone Encounter (Signed)
Please advise which OTC you prefer, Mucinex, benadryl, sudafed? thanks

## 2016-08-02 NOTE — Telephone Encounter (Signed)
Spoke with the patient, advised of options per Dr. Lacinda Axon, verbalized understanding and will return a call if not better, thanks

## 2016-08-02 NOTE — Telephone Encounter (Signed)
Sudafed for congestion if she does not have hypertension. Over-the-counter antihistamine and Flonase.

## 2016-08-02 NOTE — Telephone Encounter (Signed)
Patient requested a call from triage to discuss possible over the counter medication she can take for sinus issues. Patient has symptoms of congestion and green mucus all on left nasal only with slight swelling on her left cheek. No other symptoms to report.  Pt contact [phone]

## 2016-08-06 ENCOUNTER — Telehealth: Payer: Self-pay | Admitting: Family Medicine

## 2016-08-06 NOTE — Telephone Encounter (Signed)
Please see if the patient has checked with her pharmacy regarding these refills. It appears they were just refilled and she should have refills left. Thanks.

## 2016-08-06 NOTE — Telephone Encounter (Signed)
Pt called about needing a refill for zolpidem (AMBIEN) 5 MG tablet and simvastatin (ZOCOR) 40 MG tablet.  Pharmacy is [place] [place]  Call [phone]. Thank you!

## 2016-08-06 NOTE — Telephone Encounter (Signed)
Last OV 06/20/16 last filled  ambien 07/04/16 30 3  rf Simvastatin 07/04/16 90 1rf

## 2016-08-09 NOTE — Telephone Encounter (Signed)
Pt is requesting to have the Ambien 5mg  tablet refilled. This was not first prescribe by Dr. Ponciano Ort it was pt's previous provider. Please advise.

## 2016-08-09 NOTE — Telephone Encounter (Signed)
I caled [facility] to verify if they had rx for patient, they stated they had not received rx and did not receive this in April. I gave [facility] verbal order since rx had since been shredded. fyi

## 2016-08-10 MED ORDER — SIMVASTATIN 40 MG PO TABS: 40.0000 mg | ORAL_TABLET | Freq: Every day | ORAL | 1 refills | Status: DC

## 2016-08-10 NOTE — Addendum Note (Signed)
Addended by: Caryl Bis, Malaika Arnall G on: 08/10/2016 12:00 PM   Modules accepted: Orders

## 2016-08-10 NOTE — Telephone Encounter (Signed)
Noted. I will send simvastatin. Thanks for giving a verbal on the Ambien.

## 2016-10-12 DIAGNOSIS — D485 Neoplasm of uncertain behavior of skin: Secondary | ICD-10-CM | POA: Diagnosis not present

## 2016-10-12 DIAGNOSIS — L820 Inflamed seborrheic keratosis: Secondary | ICD-10-CM | POA: Diagnosis not present

## 2016-11-16 ENCOUNTER — Telehealth: Payer: Self-pay | Admitting: Family Medicine

## 2016-11-16 NOTE — Telephone Encounter (Signed)
Left pt message asking to call Allison back directly at [phone] to schedule AWV. Thanks! °  °*NOTE* No hx of AWV °

## 2016-12-20 NOTE — Telephone Encounter (Signed)
Scheduled 12/24/16 °

## 2016-12-24 ENCOUNTER — Encounter: Payer: Self-pay | Admitting: Family Medicine

## 2016-12-24 ENCOUNTER — Ambulatory Visit (INDEPENDENT_AMBULATORY_CARE_PROVIDER_SITE_OTHER): Payer: Medicare Other

## 2016-12-24 ENCOUNTER — Ambulatory Visit (INDEPENDENT_AMBULATORY_CARE_PROVIDER_SITE_OTHER): Payer: Medicare Other | Admitting: Family Medicine

## 2016-12-24 VITALS — BP 130/60 | HR 82 | Temp 98.4°F | Wt 143.0 lb

## 2016-12-24 VITALS — BP 130/60 | HR 82 | Temp 98.4°F | Resp 12 | Ht 62.0 in | Wt 143.0 lb

## 2016-12-24 DIAGNOSIS — G479 Sleep disorder, unspecified: Secondary | ICD-10-CM | POA: Diagnosis not present

## 2016-12-24 DIAGNOSIS — I10 Essential (primary) hypertension: Secondary | ICD-10-CM

## 2016-12-24 DIAGNOSIS — E039 Hypothyroidism, unspecified: Secondary | ICD-10-CM

## 2016-12-24 DIAGNOSIS — Z23 Encounter for immunization: Secondary | ICD-10-CM | POA: Diagnosis not present

## 2016-12-24 DIAGNOSIS — Z0000 Encounter for general adult medical examination without abnormal findings: Secondary | ICD-10-CM | POA: Diagnosis not present

## 2016-12-24 DIAGNOSIS — E2839 Other primary ovarian failure: Secondary | ICD-10-CM

## 2016-12-24 MED ORDER — ZOSTER VAC RECOMB ADJUVANTED 50 MCG/0.5ML IM SUSR: 0.5000 mL | Freq: Once | INTRAMUSCULAR | 0 refills | Status: AC

## 2016-12-24 NOTE — Progress Notes (Signed)
  [name], MD Phone: [phone]  Tammy Dalton is a 71 y.o. female who presents today for follow-up.  Hypertension: Not checking. Taking lisinopril, HCTZ. No chest pain, shortness breath, or edema.  Hypothyroidism: Taking Synthroid. No skin changes. No heat or cold intolerance. No weight changes.  Sleep difficulty: Taking Ambien 5 mg nightly. Gets about 5 hours sleep. Does occasionally take her several hours to fall sleep at night. Watches TV the hour before bed. Does have a caffeinated beverage with dinner. No depression.  PMH: Former smoker   ROS see history of present illness  Objective  Physical Exam Vitals:   12/24/16 1525  BP: 130/60  Pulse: 82  Temp: 98.4 F (36.9 C)  SpO2: 94%    BP Readings from Last 3 Encounters:  12/24/16 130/60  12/24/16 130/60  06/20/16 128/80   Wt Readings from Last 3 Encounters:  12/24/16 143 lb (64.9 kg)  12/24/16 143 lb (64.9 kg)  06/20/16 142 lb 9.6 oz (64.7 kg)    Physical Exam  Constitutional: No distress.  Cardiovascular: Normal rate, regular rhythm and normal heart sounds.   Pulmonary/Chest: Effort normal and breath sounds normal. No respiratory distress.  Musculoskeletal: She exhibits no edema.  Neurological: She is alert.  Skin: She is not diaphoretic.     Assessment/Plan: Please see individual problem list.  Hypertension Well-controlled. Continue current medications. Lab work today.  Hypothyroidism Check TSH.  Sleeping difficulty Continue to monitor. Discussed sleep hygiene measures.   Orders Placed This Encounter  Procedures  . Flu vaccine HIGH DOSE PF  . Comp Met (CMET)  . TSH    Meds ordered this encounter  Medications  . Zoster Vac Recomb Adjuvanted [facility]) injection    Sig: Inject 0.5 mLs into the muscle once.    Dispense:  0.5 mL    Refill:  0    # Healthcare maintenance: Prescription for shingles vaccine given. Flu shot given.   [name], MD [place]

## 2016-12-24 NOTE — Patient Instructions (Signed)
Nice to see you. We'll get lab work today and contact you with the results. Please try to avoid caffeinated beverages after 1 PM. Please also stop watching TV the hour prior to bed. If this does not help with your sleep in the next several weeks please let us know.

## 2016-12-24 NOTE — Assessment & Plan Note (Signed)
Continue to monitor. Discussed sleep hygiene measures.

## 2016-12-24 NOTE — Assessment & Plan Note (Signed)
Check TSH 

## 2016-12-24 NOTE — Patient Instructions (Addendum)
Tammy Dalton , Thank you for taking time to come for your Medicare Wellness Visit. I appreciate your ongoing commitment to your health goals. Please review the following plan we discussed and let me know if I can assist you in the future.   Follow up with Dr. Caryl Bis as needed.    Dexa Scan ordered; follow as directed.  Educational material provided.   Verify home vaccine record.  Bring a copy of your [place] and/or Living Will to be scanned into chart.  Have a great day!  These are the goals we discussed: Goals    . Increase water intake                  This is a list of the screening recommended for you and due dates:  Health Maintenance  Topic Date Due  . DEXA scan (bone density measurement)  11/19/2011  . Pneumonia vaccines (1 of 2 - PCV13) 05/24/2012  . Mammogram  07/18/2018  . Tetanus Vaccine  07/31/2025  . Colon Cancer Screening  04/24/2026  . Flu Shot  Completed  .  Hepatitis C: One time screening is recommended by Center for Disease Control  (CDC) for  adults born from [birth year] through 1964.   Completed   Bone Densitometry Bone densitometry is an imaging test that uses a special X-ray to measure the amount of calcium and other minerals in your bones (bone density). This test is also known as a bone mineral density test or dual-energy X-ray absorptiometry (DXA). The test can measure bone density at your hip and your spine. It is similar to having a regular X-ray. You may have this test to:  Diagnose a condition that causes weak or thin bones (osteoporosis).  Predict your risk of a broken bone (fracture).  Determine how well osteoporosis treatment is working.  Tell a health care provider about:  Any allergies you have.  All medicines you are taking, including vitamins, herbs, eye drops, creams, and over-the-counter medicines.  Any problems you or family members have had with anesthetic medicines.  Any blood disorders you have.  Any  surgeries you have had.  Any medical conditions you have.  Possibility of pregnancy.  Any other medical test you had within the previous 14 days that used contrast material. What are the risks? Generally, this is a safe procedure. However, problems can occur and may include the following:  This test exposes you to a very small amount of radiation.  The risks of radiation exposure may be greater to unborn children.  What happens before the procedure?  Do not take any calcium supplements for 24 hours before having the test. You can otherwise eat and drink what you usually do.  Take off all metal jewelry, eyeglasses, dental appliances, and any other metal objects. What happens during the procedure?  You may lie on an exam table. There will be an X-ray generator below you and an imaging device above you.  Other devices, such as boxes or braces, may be used to position your body properly for the scan.  You will need to lie still while the machine slowly scans your body.  The images will show up on a computer monitor. What happens after the procedure? You may need more testing at a later time. This information is not intended to replace advice given to you by your health care provider. Make sure you discuss any questions you have with your health care provider. Document Released: 04/03/2004 Document Revised:  08/18/2015 Document Reviewed: 08/20/2013 Elsevier Interactive Patient Education  [employer].

## 2016-12-24 NOTE — Progress Notes (Signed)
Subjective:   Tammy Dalton is a 71 y.o. female who presents for an Initial Medicare Annual Wellness Visit.  Review of Systems    No ROS.  Medicare Wellness Visit. Additional risk factors are reflected in the social history.  Cardiac Risk Factors include: advanced age (>72men, >38 women);hypertension     Objective:    Today's Vitals   12/24/16 1610  BP: 130/60  Pulse: 82  Resp: 12  Temp: 98.4 F (36.9 C)  TempSrc: Oral  SpO2: 95%  Weight: 143 lb (64.9 kg)  Height: 5\' 2"  (1.575 m)   Body mass index is 26.16 kg/m.   Current Medications (verified) Outpatient Encounter Prescriptions as of 12/24/2016  Medication Sig  . aspirin EC 81 MG tablet Take 81 mg by mouth daily.  . cholecalciferol (VITAMIN D) 1000 units tablet Take 1,000 Units by mouth daily.  . ferrous sulfate 325 (65 FE) MG tablet Take 325 mg by mouth daily with breakfast.  . levothyroxine (SYNTHROID, LEVOTHROID) 100 MCG tablet Take 1 tablet (100 mcg total) by mouth daily before breakfast.  . lisinopril-hydrochlorothiazide (PRINZIDE,ZESTORETIC) 20-25 MG tablet Take 1 tablet by mouth daily.  . Omega-3 Fatty Acids (FISH OIL) 1200 MG CAPS Take by mouth.  . simvastatin (ZOCOR) 40 MG tablet Take 1 tablet (40 mg total) by mouth daily at 6 PM.  . zolpidem (AMBIEN) 5 MG tablet Take 1 tablet (5 mg total) by mouth at bedtime as needed for sleep.  Marland Kitchen Zoster Vac Recomb Adjuvanted [facility]) injection Inject 0.5 mLs into the muscle once.   No facility-administered encounter medications on file as of 12/24/2016.     Allergies (verified) Patient has no known allergies.   History: Past Medical History:  Diagnosis Date  . Hyperlipidemia   . Hypertension   . Thyroid disease    Past Surgical History:  Procedure Laterality Date  . anal sphincterotomy and fissurectomy  2009  . COLONOSCOPY  05/24/2011   Dr. Jill Side done at [facility]  . COLONOSCOPY  05/20/2000   Dr. Jamal Collin; Sharp bend at 50  cm. Scope could not be advanced any further even with glucagon.  . COLONOSCOPY WITH PROPOFOL N/A 04/24/2016   Procedure: COLONOSCOPY WITH PROPOFOL;  Surgeon: Christene Lye, MD;  Location: [facility] ENDOSCOPY;  Service: Endoscopy;  Laterality: N/A;  . EYE SURGERY Bilateral 2010   cataract surgery.   . TONSILLECTOMY     Family History  Problem Relation Age of Onset  . Depression Mother   . Heart disease Father   . Early death Sister   . Alcohol abuse Brother   . Stroke Paternal Grandfather    Social History   Occupational History  . Not on file.   Social History Main Topics  . Smoking status: Former Smoker    Types: Cigarettes  . Smokeless tobacco: Never Used  . Alcohol use No  . Drug use: No  . Sexual activity: No    Tobacco Counseling Counseling given: Not Answered   Activities of Daily Living In your present state of health, do you have any difficulty performing the following activities: 12/24/2016  Hearing? N  Vision? N  Difficulty concentrating or making decisions? N  Walking or climbing stairs? N  Dressing or bathing? N  Doing errands, shopping? N  Preparing Food and eating ? N  Using the Toilet? N  In the past six months, have you accidently leaked urine? N  Do you have problems with loss of bowel control? N  Managing  your Medications? N  Managing your Finances? N  Housekeeping or managing your Housekeeping? N  Some recent data might be hidden    Immunizations and Health Maintenance Immunization History  Administered Date(s) Administered  . Influenza, High Dose Seasonal PF 12/24/2016  . Influenza-Unspecified 11/25/2015  . Pneumococcal-Unspecified 05/25/2011  . Tdap 08/01/2015  . Zoster 03/27/2007   Health Maintenance Due  Topic Date Due  . DEXA SCAN  11/19/2011  . PNA vac Low Risk Adult (1 of 2 - PCV13) 05/24/2012    Patient Care Team: Leone Haven, MD as PCP - General (Family Medicine) Christene Lye, MD (General  Surgery)  Indicate any recent Medical Services you may have received from other than [facility] providers in the past year (date may be approximate).     Assessment:   This is a routine wellness examination for [name]. The goal of the wellness visit is to assist the patient how to close the gaps in care and create a preventative care plan for the patient.   The roster of all physicians providing medical care to patient is listed in the Snapshot section of the chart.  Taking calcium VIT D as appropriate/Osteoporosis risk reviewed.    Safety issues reviewed; Smoke and carbon monoxide detectors in the home. No firearms in the home.  Wears seatbelts when driving or riding with others. Patient does wear sunscreen or protective clothing when in direct sunlight. No violence in the home.  Patient is alert, normal appearance, oriented to person/place/and time.  Correctly identified the president of the [place], recall of 3/3 words, and performing simple calculations. Displays appropriate judgement and can read correct time from watch face.   No new identified risk were noted.  No failures at ADL's or IADL's.    BMI- discussed the importance of a healthy diet, water intake and the benefits of aerobic exercise. Educational material provided.   24 hour diet recall: Breakfast: crackers/nabs Lunch: half of a salad Dinner: hotdog   Daily fluid intake: 1 cups of caffeine, 4 cups of water  Dental- every 4 months.  Eye- Visual acuity not assessed per patient preference since they have regular follow up with the ophthalmologist.  Wears corrective lenses.  Sleep patterns- Sleeps 5-6  hours at night.  Wakes feeling rested.  Dexa Scan ordered; follow as directed.  Educational material provided.  Pneumonia vaccines discussed; she is unsure which one she has received and plans to verify, follow up with PCP.    Patient Concerns: None at this time. Follow up with PCP as needed.  Hearing/Vision  screen Hearing Screening Comments: Patient is able to hear conversational tones without difficulty.  No issues reported.   Vision Screening Comments: Followed by [facility] (Dr. Jeni Salles) Wears lenses when reading Last OV 2018 Cataract extraction, bilateral Visual acuity not assessed per patient preference since they have regular follow up with the ophthalmologist  Dietary issues and exercise activities discussed: Current Exercise Habits: Structured exercise class, Type of exercise: walking (golf, softball), Time (Minutes): 60, Frequency (Times/Week): 4, Weekly Exercise (Minutes/Week): 240, Intensity: Moderate  Goals    . Increase water intake                 Depression Screen PHQ 2/9 Scores 12/24/2016 12/24/2016  PHQ - 2 Score 0 0    Fall Risk Fall Risk  12/24/2016 12/24/2016  Falls in the past year? No No    Cognitive Function: MMSE - Mini Mental State Exam 12/24/2016  Orientation to time 5  Orientation to Place 5  Registration 3  Attention/ Calculation 5  Recall 3  Language- name 2 objects 2  Language- repeat 1  Language- follow 3 step command 3  Language- read & follow direction 1  Write a sentence 1  Copy design 1  Total score 30        Screening Tests Health Maintenance  Topic Date Due  . DEXA SCAN  11/19/2011  . PNA vac Low Risk Adult (1 of 2 - PCV13) 05/24/2012  . MAMMOGRAM  07/18/2018  . TETANUS/TDAP  07/31/2025  . COLONOSCOPY  04/24/2026  . INFLUENZA VACCINE  Completed  . Hepatitis C Screening  Completed      Plan:    End of life planning; Advance aging; Advanced directives discussed. Copy of current HCPOA/Living Will requested.    I have personally reviewed and noted the following in the patient's chart:   . Medical and social history . Use of alcohol, tobacco or illicit drugs  . Current medications and supplements . Functional ability and status . Nutritional status . Physical activity . Advanced directives . List of other  physicians . Hospitalizations, surgeries, and ER visits in previous 12 months . Vitals . Screenings to include cognitive, depression, and falls . Referrals and appointments  In addition, I have reviewed and discussed with patient certain preventive protocols, quality metrics, and best practice recommendations. A written personalized care plan for preventive services as well as general preventive health recommendations were provided to patient.     Varney Biles, LPN   [id number]/06/6948

## 2016-12-24 NOTE — Assessment & Plan Note (Signed)
Well-controlled. Continue current medications. Lab work today.

## 2016-12-25 LAB — COMPREHENSIVE METABOLIC PANEL
ALBUMIN: 4.3 g/dL (ref 3.5–5.2)
ALK PHOS: 90 U/L (ref 39–117)
ALT: 69 U/L — ABNORMAL HIGH (ref 0–35)
AST: 55 U/L — ABNORMAL HIGH (ref 0–37)
BUN: 18 mg/dL (ref 6–23)
CALCIUM: 9.6 mg/dL (ref 8.4–10.5)
CO2: 31 mEq/L (ref 19–32)
CREATININE: 1.12 mg/dL (ref 0.40–1.20)
Chloride: 101 mEq/L (ref 96–112)
GFR: 51.1 mL/min — ABNORMAL LOW (ref >60.00)
Glucose, Bld: 77 mg/dL (ref 70–99)
POTASSIUM: 3.8 meq/L (ref 3.5–5.1)
SODIUM: 140 meq/L (ref 135–145)
TOTAL PROTEIN: 6.5 g/dL (ref 6.0–8.3)
Total Bilirubin: 0.4 mg/dL (ref 0.2–1.2)

## 2016-12-25 LAB — TSH: TSH: 1.19 u[IU]/mL (ref 0.35–4.50)

## 2016-12-29 ENCOUNTER — Other Ambulatory Visit: Payer: Self-pay | Admitting: Family Medicine

## 2016-12-29 DIAGNOSIS — R945 Abnormal results of liver function studies: Principal | ICD-10-CM

## 2016-12-29 DIAGNOSIS — R7989 Other specified abnormal findings of blood chemistry: Secondary | ICD-10-CM

## 2017-01-14 ENCOUNTER — Other Ambulatory Visit (INDEPENDENT_AMBULATORY_CARE_PROVIDER_SITE_OTHER): Payer: Medicare Other

## 2017-01-14 ENCOUNTER — Other Ambulatory Visit: Payer: Self-pay

## 2017-01-14 DIAGNOSIS — R945 Abnormal results of liver function studies: Secondary | ICD-10-CM

## 2017-01-14 DIAGNOSIS — R7989 Other specified abnormal findings of blood chemistry: Secondary | ICD-10-CM

## 2017-01-14 LAB — HEPATIC FUNCTION PANEL
ALT: 18 U/L (ref 0–35)
AST: 23 U/L (ref 0–37)
Albumin: 4.4 g/dL (ref 3.5–5.2)
Alkaline Phosphatase: 75 U/L (ref 39–117)
BILIRUBIN DIRECT: 0.1 mg/dL (ref 0.0–0.3)
BILIRUBIN TOTAL: 0.5 mg/dL (ref 0.2–1.2)
Total Protein: 6.9 g/dL (ref 6.0–8.3)

## 2017-01-14 MED ORDER — LISINOPRIL-HYDROCHLOROTHIAZIDE 20-25 MG PO TABS: 1.0000 | ORAL_TABLET | Freq: Every day | ORAL | 1 refills | Status: DC

## 2017-01-14 NOTE — Telephone Encounter (Signed)
Last OV 12/24/16 last filled 07/04/16 90 1rf

## 2017-01-15 ENCOUNTER — Encounter: Payer: Self-pay | Admitting: Family Medicine

## 2017-01-15 DIAGNOSIS — M8588 Other specified disorders of bone density and structure, other site: Secondary | ICD-10-CM | POA: Diagnosis not present

## 2017-01-15 DIAGNOSIS — Z780 Asymptomatic menopausal state: Secondary | ICD-10-CM | POA: Diagnosis not present

## 2017-02-17 ENCOUNTER — Other Ambulatory Visit: Payer: Self-pay | Admitting: Family Medicine

## 2017-02-18 NOTE — Telephone Encounter (Signed)
ok'd rx for ambien #30with no refills.   

## 2017-02-18 NOTE — Telephone Encounter (Signed)
Last OV 12/24/16 last filled  07/04/16 30 3rf

## 2017-02-19 NOTE — Telephone Encounter (Signed)
RX faxed

## 2017-02-19 NOTE — Telephone Encounter (Signed)
Has this been faxed?

## 2017-02-26 DIAGNOSIS — Z85828 Personal history of other malignant neoplasm of skin: Secondary | ICD-10-CM | POA: Diagnosis not present

## 2017-04-02 ENCOUNTER — Telehealth: Payer: Self-pay | Admitting: Internal Medicine

## 2017-04-03 NOTE — Telephone Encounter (Signed)
Last OV 12/24/2016 Next OV 06/24/2017 Last refill 02/18/2017

## 2017-04-04 NOTE — Telephone Encounter (Signed)
Please fax

## 2017-04-13 ENCOUNTER — Other Ambulatory Visit: Payer: Self-pay

## 2017-04-13 ENCOUNTER — Emergency Department: Payer: Medicare Other

## 2017-04-13 ENCOUNTER — Emergency Department
Admission: EM | Admit: 2017-04-13 | Discharge: 2017-04-13 | Disposition: A | Discharge status: Home / Self Care | Payer: Medicare Other | Attending: Emergency Medicine | Admitting: Emergency Medicine

## 2017-04-13 ENCOUNTER — Encounter: Payer: Self-pay | Admitting: Emergency Medicine

## 2017-04-13 DIAGNOSIS — Z85828 Personal history of other malignant neoplasm of skin: Secondary | ICD-10-CM | POA: Diagnosis not present

## 2017-04-13 DIAGNOSIS — Z7982 Long term (current) use of aspirin: Secondary | ICD-10-CM | POA: Diagnosis not present

## 2017-04-13 DIAGNOSIS — E039 Hypothyroidism, unspecified: Secondary | ICD-10-CM | POA: Insufficient documentation

## 2017-04-13 DIAGNOSIS — Z23 Encounter for immunization: Secondary | ICD-10-CM | POA: Insufficient documentation

## 2017-04-13 DIAGNOSIS — Y998 Other external cause status: Secondary | ICD-10-CM | POA: Insufficient documentation

## 2017-04-13 DIAGNOSIS — I10 Essential (primary) hypertension: Secondary | ICD-10-CM | POA: Diagnosis not present

## 2017-04-13 DIAGNOSIS — W0110XA Fall on same level from slipping, tripping and stumbling with subsequent striking against unspecified object, initial encounter: Secondary | ICD-10-CM | POA: Diagnosis not present

## 2017-04-13 DIAGNOSIS — Y92018 Other place in single-family (private) house as the place of occurrence of the external cause: Secondary | ICD-10-CM | POA: Diagnosis not present

## 2017-04-13 DIAGNOSIS — Z79899 Other long term (current) drug therapy: Secondary | ICD-10-CM | POA: Insufficient documentation

## 2017-04-13 DIAGNOSIS — Y9389 Activity, other specified: Secondary | ICD-10-CM | POA: Insufficient documentation

## 2017-04-13 DIAGNOSIS — Z87891 Personal history of nicotine dependence: Secondary | ICD-10-CM | POA: Insufficient documentation

## 2017-04-13 DIAGNOSIS — S0101XA Laceration without foreign body of scalp, initial encounter: Secondary | ICD-10-CM | POA: Insufficient documentation

## 2017-04-13 DIAGNOSIS — S0990XA Unspecified injury of head, initial encounter: Secondary | ICD-10-CM | POA: Diagnosis not present

## 2017-04-13 MED ORDER — ACETAMINOPHEN 325 MG PO TABS
650.0000 mg | ORAL_TABLET | Freq: Once | ORAL | Status: AC
  Administered 2017-04-13: 650 mg via ORAL
  Filled 2017-04-13: qty 2

## 2017-04-13 MED ORDER — CEPHALEXIN 500 MG PO CAPS: 500.0000 mg | ORAL_CAPSULE | Freq: Three times a day (TID) | ORAL | 0 refills | Status: DC

## 2017-04-13 MED ORDER — LIDOCAINE-EPINEPHRINE-TETRACAINE (LET) SOLUTION
3.0000 mL | Freq: Once | NASAL | Status: AC
  Administered 2017-04-13: 3 mL via TOPICAL

## 2017-04-13 MED ORDER — LIDOCAINE-EPINEPHRINE-TETRACAINE (LET) SOLUTION
NASAL | Status: AC
  Administered 2017-04-13: 17:00:00 3 mL via TOPICAL
  Filled 2017-04-13: qty 3

## 2017-04-13 MED ORDER — TETANUS-DIPHTH-ACELL PERTUSSIS 5-2.5-18.5 LF-MCG/0.5 IM SUSP
0.5000 mL | Freq: Once | INTRAMUSCULAR | Status: AC
  Administered 2017-04-13: 0.5 mL via INTRAMUSCULAR
  Filled 2017-04-13: qty 0.5

## 2017-04-13 NOTE — ED Triage Notes (Signed)
Slipped and fell aprox 30 min ago and hit R scalp, laceration and smash type wound. No LOC. Bleeding controlled now.

## 2017-04-13 NOTE — ED Provider Notes (Signed)
[facility] Emergency Department Provider Note  ____________________________________________  Time seen: Approximately 5:45 PM  I have reviewed the triage vital signs and the nursing notes.   HISTORY  Chief Complaint Head Laceration    HPI Tammy Dalton is a 72 y.o. female presents to the emergency department after patient slipped and fell while feeding her outdoor birds.  Patient did not lose consciousness.  She is complaining of no new blurry vision, nausea, vomiting or abdominal pain.  Patient is accompanied by her partner, who reports no new changes in behavior.  She is ambulating without difficulty.  No neck pain, weakness, radiculopathy or changes in sensation in the upper or lower extremities.  Past Medical History:  Diagnosis Date  . Hyperlipidemia   . Hypertension   . Thyroid disease     Patient Active Problem List   Diagnosis Date Noted  . Hyperlipidemia 06/20/2016  . Hypertension 06/20/2016  . Hypothyroidism 06/20/2016  . Sleeping difficulty 06/20/2016  . Skin cancer 06/20/2016    Past Surgical History:  Procedure Laterality Date  . anal sphincterotomy and fissurectomy  2009  . COLONOSCOPY  05/24/2011   Dr. Jill Side done at [facility]  . COLONOSCOPY  05/20/2000   Dr. Jamal Collin; Sharp bend at 50 cm. Scope could not be advanced any further even with glucagon.  . COLONOSCOPY WITH PROPOFOL N/A 04/24/2016   Procedure: COLONOSCOPY WITH PROPOFOL;  Surgeon: Christene Lye, MD;  Location: [facility] ENDOSCOPY;  Service: Endoscopy;  Laterality: N/A;  . EYE SURGERY Bilateral 2010   cataract surgery.   . TONSILLECTOMY      Prior to Admission medications   Medication Sig Start Date End Date Taking? Authorizing Provider  aspirin EC 81 MG tablet Take 81 mg by mouth daily.    [provider]  cephALEXin (KEFLEX) 500 MG capsule Take 1 capsule (500 mg total) by mouth 3 (three) times daily for 10 days. 04/13/17  04/23/17  Lannie Fields, PA-C  cholecalciferol (VITAMIN D) 1000 units tablet Take 1,000 Units by mouth daily.    [provider]  ferrous sulfate 325 (65 FE) MG tablet Take 325 mg by mouth daily with breakfast.    [provider]  levothyroxine (SYNTHROID, LEVOTHROID) 100 MCG tablet Take 1 tablet (100 mcg total) by mouth daily before breakfast. 07/09/16   Leone Haven, MD  lisinopril-hydrochlorothiazide (PRINZIDE,ZESTORETIC) 20-25 MG tablet Take 1 tablet by mouth daily. 01/14/17   Leone Haven, MD  Omega-3 Fatty Acids (FISH OIL) 1200 MG CAPS Take by mouth.    [provider]  simvastatin (ZOCOR) 40 MG tablet Take 1 tablet (40 mg total) by mouth daily at 6 PM. 08/10/16   Leone Haven, MD  zolpidem (AMBIEN) 5 MG tablet TAKE 1 TABLET BY MOUTH AT BEDTIME AS NEEDED FOR SLEEP 04/04/17   Leone Haven, MD    Allergies Patient has no known allergies.  Family History  Problem Relation Age of Onset  . Depression Mother   . Heart disease Father   . Early death Sister   . Alcohol abuse Brother   . Stroke Paternal Grandfather     Social History Social History   Tobacco Use  . Smoking status: Former Smoker    Types: Cigarettes  . Smokeless tobacco: Never Used  Substance Use Topics  . Alcohol use: No  . Drug use: No     Review of Systems  Constitutional: No fever/chills Eyes: No visual changes. No discharge ENT:  No upper respiratory complaints. Cardiovascular: no chest pain. Respiratory: no cough. No SOB. Musculoskeletal: Negative for musculoskeletal pain. Skin: Patient has 2 cm scalp laceration.  Neurological: Negative for headaches, focal weakness or numbness.   ____________________________________________   PHYSICAL EXAM:  VITAL SIGNS: ED Triage Vitals [04/13/17 1518]  Enc Vitals Group     BP (!) 170/80     Pulse Rate 100     Resp 18     Temp 97.9 F (36.6 C)     Temp Source Oral     SpO2 99 %     Weight 140 lb (63.5 kg)      Height 5\' 2"  (1.575 m)     Head Circumference      Peak Flow      Pain Score 7     Pain Loc      Pain Edu?      Excl. in [place]?      Constitutional: Alert and oriented. Well appearing and in no acute distress. Eyes: Conjunctivae are normal. PERRL. EOMI. Head: Atraumatic. ENT:      Ears: TMs are pearly.       Nose: No congestion/rhinnorhea.      Mouth/Throat: Mucous membranes are moist.  Neck: No stridor. No cervical spine tenderness to palpation. Hematological/Lymphatic/Immunilogical: No cervical lymphadenopathy.  Cardiovascular: Normal rate, regular rhythm. Normal S1 and S2.  Good peripheral circulation. Respiratory: Normal respiratory effort without tachypnea or retractions. Lungs CTAB. Good air entry to the bases with no decreased or absent breath sounds. Gastrointestinal: Bowel sounds 4 quadrants. Soft and nontender to palpation. No guarding or rigidity. No palpable masses. No distention. No CVA tenderness. Musculoskeletal: Full range of motion to all extremities. No gross deformities appreciated. Neurologic:  Normal speech and language. No gross focal neurologic deficits are appreciated.  Skin: Patient has 2 cm right parietal scalp laceration.  ____________________________________________   LABS (all labs ordered are listed, but only abnormal results are displayed)  Labs Reviewed - No data to display ____________________________________________  EKG   ____________________________________________  RADIOLOGY Unk Pinto, personally viewed and evaluated these images (plain radiographs) as part of my medical decision making, as well as reviewing the written report by the radiologist.  Ct Head Wo Contrast  Result Date: 04/13/2017 CLINICAL DATA:  72 year old female with history of trauma from a fall with laceration to the right side of the head today. Currently on blood thinners. No associated loss of consciousness. EXAM: CT HEAD WITHOUT CONTRAST TECHNIQUE:  Contiguous axial images were obtained from the base of the skull through the vertex without intravenous contrast. COMPARISON:  No priors. 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 No evidence of acute infarction, hemorrhage, hydrocephalus, extra-axial collection or mass lesion/mass effect. Vascular: No hyperdense vessel or unexpected calcification. Skull: Normal. Negative for fracture or focal lesion. Sinuses/Orbits: No acute finding. Other: Soft tissue irregularity and gas in the right frontoparietal scalp, presumably at the site of laceration. IMPRESSION: 1. Probable right frontoparietal scalp laceration without evidence of underlying acute displaced skull fracture or signs of significant acute intracranial trauma. 2. Very mild chronic microvascular ischemic changes in the cerebral white matter, as above. Electronically Signed   By: Vinnie Langton M.D.   On: 04/13/2017 16:49    ____________________________________________    PROCEDURES  Procedure(s) performed:    Procedures  LACERATION REPAIR Performed by: Lannie Fields Authorized by: Ellison Hughs  [place] Plan Consent: Verbal consent obtained. Risks and benefits: risks, benefits and alternatives were discussed Consent given by: patient Patient identity confirmed: provided demographic data Prepped and Draped in normal sterile fashion Wound explored  Laceration Location: Right parietal scalp.   Laceration Length: 2 cm  No Foreign Bodies seen or palpated  Anesthesia: local infiltration  Local anesthetic: LET Anesthetic total: 3 ml  Irrigation method: syringe Amount of cleaning: standard  Skin closure: Staples   Number of staples: 3  Patient tolerance: Patient tolerated the procedure well with no immediate complications.   Medications   lidocaine-EPINEPHrine-tetracaine (LET) solution (3 mLs Topical Given by Other 04/13/17 1719)  Tdap (BOOSTRIX) injection 0.5 mL (0.5 mLs Intramuscular Given 04/13/17 1714)  acetaminophen (TYLENOL) tablet 650 mg (650 mg Oral Given 04/13/17 1718)     ____________________________________________   INITIAL IMPRESSION / ASSESSMENT AND PLAN / ED COURSE  Pertinent labs & imaging results that were available during my care of the patient were reviewed by me and considered in my medical decision making (see chart for details).  Review of the [place] CSRS was performed in accordance of the [place] prior to dispensing any controlled drugs.     Assessment and Plan: Scalp Laceration:  Subdural Hematoma:  Differential diagnosis originally included scalp laceration, subdural hematoma and cranial fracture. Patient presents to the emergency department with a 2 cm right parietal scalp laceration repaired with staples without complication.  CT head revealed no acute fractures or other acute abnormalities.  Patient was advised to have staples removed in 5 days.  Tylenol was given in the emergency department for pain.  Patient was discharged with Keflex.     ____________________________________________  FINAL CLINICAL IMPRESSION(S) / ED DIAGNOSES  Final diagnoses:  Laceration of scalp, initial encounter      NEW MEDICATIONS STARTED DURING THIS VISIT:  ED Discharge Orders        Ordered    cephALEXin (KEFLEX) 500 MG capsule  3 times daily     04/13/17 1709          This chart was dictated using voice recognition software/Dragon. Despite best efforts to proofread, errors can occur which can change the meaning. Any change was purely unintentional.    Lannie Fields, PA-C 04/13/17 1757    Delman Kitten, MD 04/14/17 [phone]

## 2017-04-13 NOTE — ED Notes (Addendum)
Slipped and fell on wet leaves today - lac to rt side of head - on blood thinners (baby aspirin every other day). Bleeding is controlled.

## 2017-04-15 ENCOUNTER — Telehealth: Payer: Self-pay

## 2017-04-15 ENCOUNTER — Other Ambulatory Visit: Payer: Self-pay

## 2017-04-15 NOTE — Telephone Encounter (Signed)
Left message to return call, ok for pec to speak to patient 

## 2017-04-15 NOTE — Telephone Encounter (Signed)
Please advise 

## 2017-04-15 NOTE — Telephone Encounter (Signed)
Copied from [place] [id number]. Topic: General - Other >> Apr 15, 2017  9:26 AM Carolyn Stare wrote:  Pt fell and hit her head and went to the ER and was given a antibiotic CEPHALEXIN that is now giving her diarrhea and she is asking if she should continue to take it . Would like a call back   [phone]  >> Apr 15, 2017  3:30 PM Cleaster Corin, [place] wrote: Pt. Returning call to Dr. Caryl Bis pt. Said she had 6/7 bm's last night after taking a pill. 2/3 today without taking any pills.the staples are on the right side of her head. Pt. States that she thinks the dosage is to high for her. Pt. Can be reached at [phone]

## 2017-04-15 NOTE — Telephone Encounter (Signed)
Copied from [place] [phone]. Topic: General - Other >> Apr 15, 2017  9:26 AM Carolyn Stare wrote:  Pt fell and hit her head and went to the ER and was given a antibiotic CEPHALEXIN that is now giving her diarrhea and she is asking if she should continue to take it . Would like a call back   [phone]

## 2017-04-15 NOTE — Telephone Encounter (Signed)
See other phone note

## 2017-04-15 NOTE — Telephone Encounter (Signed)
Please find out how many times a day is she having bowel movements.  Has she continued to urinate normally?  Has she had any fevers, redness, or signs of infection where she had staples placed?  Once I know these things I can advise on what the next best step is.  Thanks.

## 2017-04-15 NOTE — Telephone Encounter (Signed)
Copied from [place] [phone]. Topic: General - Other >> Apr 15, 2017  9:26 AM Carolyn Stare wrote:  Pt fell and hit her head and went to the ER and was given a antibiotic CEPHALEXIN that is now giving her diarrhea and she is asking if she should continue to take it . Would like a call back   [phone]  >> Apr 15, 2017  3:30 PM Cleaster Corin, [place] wrote: Pt. Returning call to Dr. Caryl Bis pt. Said she had 6/7 bm's last night after taking a pill. 2/3 today without taking any pills.the staples are on the right side of her head. Pt. States that she thinks the dosage is to high for her. Pt. Can be reached at [phone]

## 2017-04-15 NOTE — Addendum Note (Signed)
Addended by: Leone Haven on: 04/15/2017 05:21 PM   Modules accepted: Orders

## 2017-04-15 NOTE — Telephone Encounter (Signed)
I spoke with the patient regarding her concerns.  She notes she had diarrhea last night and stopped taking the Keflex.  She has not had any diarrhea today and feels well.  She had no fevers, redness, swelling, or drainage.  She notes she feels well.  She is coming in on Thursday to have the staples removed.  She notes she was advised in the ED that they did not think she needed the Keflex though they prescribed it just in case.  I advised that she could come off the Keflex and monitor for any signs of infection.  If she develops signs of infection she needs to get looked at.  She voiced understanding.

## 2017-04-15 NOTE — Telephone Encounter (Signed)
Only SX is Diarrhea.  Please advise.

## 2017-04-15 NOTE — Telephone Encounter (Signed)
This may be duplicate

## 2017-04-18 ENCOUNTER — Ambulatory Visit (INDEPENDENT_AMBULATORY_CARE_PROVIDER_SITE_OTHER): Payer: Medicare Other | Admitting: Family Medicine

## 2017-04-18 ENCOUNTER — Encounter: Payer: Self-pay | Admitting: Family Medicine

## 2017-04-18 VITALS — BP 122/70 | HR 73 | Temp 98.0°F | Resp 18 | Ht 62.0 in | Wt 139.1 lb

## 2017-04-18 DIAGNOSIS — S0101XD Laceration without foreign body of scalp, subsequent encounter: Secondary | ICD-10-CM

## 2017-04-18 DIAGNOSIS — Z4802 Encounter for removal of sutures: Secondary | ICD-10-CM

## 2017-04-18 NOTE — Progress Notes (Signed)
Pre-visit discussion using our clinic review tool. No additional management support is needed unless otherwise documented below in the visit note.  

## 2017-04-18 NOTE — Progress Notes (Signed)
Subjective:    Patient ID: Tammy Dalton, female    DOB: 09/25/1945, 72 y.o.   MRN: [id number]  HPI  Tammy Dalton is a 72 year old female who presents today to have staples removed from her scalp. Staples were placed on 04/13/17 after a slip on wet leaves and fall that was evaluated in the ED.   Three staples were placed on the right parietal scalp. No erythema, warmth, drainage, or soreness is present today. She was provided cephalexin at her ED visit but developed diarrhea after taking this medication. She contacted her PCP who advised her to come off of cephalexin and monitor for s/sx of infection.  She denies fever, chills, sweats, and reports feeling well. Diarrhea stopped after discontinuing cephalexin. She has been cleaning area with soap and water and has applied hydrogen peroxide daily to area.   Review of Systems  Constitutional: Negative for chills, fatigue and fever.  Respiratory: Negative for cough, shortness of breath and wheezing.   Cardiovascular: Negative for chest pain and palpitations.  Gastrointestinal: Negative for abdominal pain.  Skin: Negative for rash.       Three staples need to be removed   Past Medical History:  Diagnosis Date  . Hyperlipidemia   . Hypertension   . Thyroid disease      Social History   Socioeconomic History  . Marital status: Single    Spouse name: Not on file  . Number of children: Not on file  . Years of education: Not on file  . Highest education level: Not on file  Social Needs  . Financial resource strain: Not on file  . Food insecurity - worry: Not on file  . Food insecurity - inability: Not on file  . Transportation needs - medical: Not on file  . Transportation needs - non-medical: Not on file  Occupational History  . Not on file  Tobacco Use  . Smoking status: Former Smoker    Types: Cigarettes  . Smokeless tobacco: Never Used  Substance and Sexual Activity  . Alcohol use: No  . Drug use: No  . Sexual activity: No    Other Topics Concern  . Not on file  Social History Narrative  . Not on file    Past Surgical History:  Procedure Laterality Date  . anal sphincterotomy and fissurectomy  2009  . COLONOSCOPY  05/24/2011   Dr. Jill Side done at [facility]  . COLONOSCOPY  05/20/2000   Dr. Jamal Collin; Sharp bend at 50 cm. Scope could not be advanced any further even with glucagon.  . COLONOSCOPY WITH PROPOFOL N/A 04/24/2016   Procedure: COLONOSCOPY WITH PROPOFOL;  Surgeon: Christene Lye, MD;  Location: [facility] ENDOSCOPY;  Service: Endoscopy;  Laterality: N/A;  . EYE SURGERY Bilateral 2010   cataract surgery.   . TONSILLECTOMY      Family History  Problem Relation Age of Onset  . Depression Mother   . Heart disease Father   . Early death Sister   . Alcohol abuse Brother   . Stroke Paternal Grandfather     No Known Allergies  Current Outpatient Medications on File Prior to Visit  Medication Sig Dispense Refill  . aspirin EC 81 MG tablet Take 81 mg by mouth daily.    . cholecalciferol (VITAMIN D) 1000 units tablet Take 1,000 Units by mouth daily.    . ferrous sulfate 325 (65 FE) MG tablet Take 325 mg by mouth daily with breakfast.    .  levothyroxine (SYNTHROID, LEVOTHROID) 100 MCG tablet Take 1 tablet (100 mcg total) by mouth daily before breakfast. 90 tablet 3  . lisinopril-hydrochlorothiazide (PRINZIDE,ZESTORETIC) 20-25 MG tablet Take 1 tablet by mouth daily. 90 tablet 1  . Omega-3 Fatty Acids (FISH OIL) 1200 MG CAPS Take by mouth.    . simvastatin (ZOCOR) 40 MG tablet Take 1 tablet (40 mg total) by mouth daily at 6 PM. 90 tablet 1  . zolpidem (AMBIEN) 5 MG tablet TAKE 1 TABLET BY MOUTH AT BEDTIME AS NEEDED FOR SLEEP 30 tablet 3   No current facility-administered medications on file prior to visit.     BP 122/70 (BP Location: Left Arm, Patient Position: Sitting, Cuff Size: Normal)   Pulse 73   Temp 98 F (36.7 C) (Oral)   Resp 18   Ht 5\' 2"  (1.575 m)   Wt  139 lb 2 oz (63.1 kg)   SpO2 97%   BMI 25.45 kg/m       Objective:   Physical Exam  Constitutional: She is oriented to person, place, and time. She appears well-developed and well-nourished.  Eyes: Pupils are equal, round, and reactive to light. No scleral icterus.  Neck: Neck supple.  Cardiovascular: Normal rate and regular rhythm.  Pulmonary/Chest: Effort normal and breath sounds normal. She has no wheezes.  Lymphadenopathy:    She has no cervical adenopathy.  Neurological: She is alert and oriented to person, place, and time.  Skin: Skin is warm and dry.  Three staples present on right parietal scalp. Healing laceration approximately 2 cm in length. No erythema, edema, warmth, or drainage noted. Eschar present       Assessment & Plan:  1. Laceration of scalp, subsequent encounter Healing without complications. Staples removed.  2. Encounter for staple removal Three staples removed without difficulty. No bleeding, pain, erythema, edema, or drainage noted. Cleaned area with saline and applied topic antibiotic cream. Advised patient to wash area with soap and water, and follow up if area does not continue to heal, becomes red, painful, or edema or drainage presents. She voiced understanding and agreed with plan.  Delano Metz, FNP-C

## 2017-05-31 ENCOUNTER — Encounter: Payer: Self-pay | Admitting: Family Medicine

## 2017-05-31 ENCOUNTER — Telehealth: Payer: Self-pay | Admitting: Family Medicine

## 2017-05-31 ENCOUNTER — Ambulatory Visit: Payer: Medicare Other | Admitting: Family Medicine

## 2017-05-31 VITALS — BP 130/70 | HR 97 | Temp 98.9°F | Wt 140.8 lb

## 2017-05-31 DIAGNOSIS — R05 Cough: Secondary | ICD-10-CM | POA: Diagnosis not present

## 2017-05-31 DIAGNOSIS — J111 Influenza due to unidentified influenza virus with other respiratory manifestations: Secondary | ICD-10-CM

## 2017-05-31 DIAGNOSIS — R059 Cough, unspecified: Secondary | ICD-10-CM

## 2017-05-31 LAB — POC INFLUENZA A&B (BINAX/QUICKVUE)
Influenza A, POC: POSITIVE — AB
Influenza B, POC: NEGATIVE

## 2017-05-31 MED ORDER — OSELTAMIVIR PHOSPHATE 75 MG PO CAPS: 75.0000 mg | ORAL_CAPSULE | Freq: Two times a day (BID) | ORAL | 0 refills | Status: DC

## 2017-05-31 NOTE — Patient Instructions (Addendum)
Start tamiflu, rest and fluids, and update [place] as needed.    Take care.  Glad to see you.

## 2017-05-31 NOTE — Progress Notes (Signed)
duration of symptoms: started about 2 days ago Wednesday AM "something didn't feel right."  Has been fatigued, resting in bed more yesterday and today.  Recently photosensitive.  Rhinorrhea: yes congestion:yes ear pain: no sore throat:no Cough:yes, ribs sore from coughing.   Myalgias: no Fever: 100.6 this AM.   Had a flu shot.   Dec in appetite.  Some nausea, vomited once (unclear if from taking cough medicine on empty stomach).  One episode of diarrhea.    Per HPI unless specifically indicated in ROS section   Meds, vitals, and allergies reviewed.   GEN: nad, alert and oriented HEENT: mucous membranes moist, TM w/o erythema, nasal epithelium injected, OP with cobblestoning NECK: supple w/o LA CV: rrr. PULM: ctab, no inc wob ABD: soft, +bs EXT: no edema

## 2017-05-31 NOTE — Telephone Encounter (Signed)
Copied from [place] [id number]. Topic: General - Other >> May 31, 2017 11:21 AM Darl Householder, RMA wrote: Reason for CRM: patient is requesting a call back concerning a prescription for cough

## 2017-05-31 NOTE — Telephone Encounter (Signed)
Patient was scheduled with Dr.Duncan today

## 2017-06-02 DIAGNOSIS — J111 Influenza due to unidentified influenza virus with other respiratory manifestations: Secondary | ICD-10-CM | POA: Insufficient documentation

## 2017-06-02 NOTE — Assessment & Plan Note (Signed)
Flu positive.  She has taken tamiflu in the past w/o ADE.  D/w pt.  Routine cautions given to patient.  Start Tamiflu.  She has tolerated this medication in the past.  Okay for outpatient follow-up.  Nontoxic.  Routine supportive care otherwise. She likely has relatively attenuated symptoms given previous vaccination.

## 2017-06-03 ENCOUNTER — Ambulatory Visit: Payer: Self-pay | Admitting: *Deleted

## 2017-06-03 NOTE — Telephone Encounter (Addendum)
Katrese RN at [facility] spoke with pt and pt was not having any difficulty breathing.Please advise. Pt was seen 05/31/17.

## 2017-06-03 NOTE — Addendum Note (Signed)
Addended by: Tonia Ghent on: 06/03/2017 02:06 PM   Modules accepted: Orders

## 2017-06-03 NOTE — Telephone Encounter (Signed)
  Reason for Disposition . Caller has NON-URGENT medication question about med that PCP prescribed and triager unable to answer question  Answer Assessment - Initial Assessment Questions 1. ONSET: "When did the swelling start?" (e.g., minutes, hours, days)    06/02/17 2. LOCATION: "What part of the tongue is swollen?" Left side 3. SEVERITY: "How swollen is it?"     moderate 4. CAUSE: "What do you think is causing the tongue swelling?" (e.g., hx of angioedema, allergies)     Not sure if from tamiflu or biting her tongue 5. RECURRENT SYMPTOM: "Have you had tongue swelling before?" If so, ask: "When was the last time?" "What happened that time?"     no 6. OTHER SYMPTOMS: "Do you have any other symptoms?" (e.g., difficulty breathing, facial swelling)     no 7. PREGNANCY: "Is there any chance you are pregnant?" "When was your last menstrual period?"     no  Protocols used: MEDICATION QUESTION CALL-A-AH, TONGUE SWELLING-A-AH

## 2017-06-03 NOTE — Telephone Encounter (Signed)
Pt called stating that she received tamiflu from Dr Damita Dunnings on 05/31/17; she thinks that she may have some tongue swelling because it is sore on the left side (06/02/17), coughing, and hoarseness; the pt also says that she has an implant on that side and perhaps she bit her tongue; she is also wondering if she can take cough syrup with the tamiflu and does she need to stop taking the tamiflu; will route to office for provider review; please calll pt back at [phone].    Reason for Disposition . Caller has URGENT medication question about med that PCP prescribed and triager unable to answer question . Cough  Answer Assessment - Initial Assessment Questions 1. SYMPTOMS: "Do you have any symptoms?"     Left side of tongue swollen moderately, moderate pain to tongue; cough, hoarseness 2. SEVERITY: If symptoms are present, ask "Are they mild, moderate or severe?"     moderate  Answer Assessment - Initial Assessment Questions 1. ONSET: "When did the cough begin?"      Before she went to MD office on Friday 05/31/17 2. SEVERITY: "How bad is the cough today?"     modertate 3. RESPIRATORY DISTRESS: "Describe your breathing."      ok 4. FEVER: "Do you have a fever?" If so, ask: "What is your temperature, how was it measured, and when did it start?"     no 5. HEMOPTYSIS: "Are you coughing up any blood?" If so ask: "How much?" (flecks, streaks, tablespoons, etc.)     no 6. TREATMENT: "What have you done so far to treat the cough?" (e.g., meds, fluids, humidifier)     Taking tamiflu 7. CARDIAC HISTORY: "Do you have any history of heart disease?" (e.g., heart attack, congestive heart failure)      htn 8. LUNG HISTORY: "Do you have any history of lung disease?"  (e.g., pulmonary embolus, asthma, emphysema)     no 9. PE RISK FACTORS: "Do you have a history of blood clots?" (or: recent major surgery, recent prolonged travel, bedridden )     no 10. OTHER SYMPTOMS: "Do you have any other symptoms? (e.g.,  runny nose, wheezing, chest pain)       Hoarse  11. PREGNANCY: "Is there any chance you are pregnant?" "When was your last menstrual period?"       \no 12. TRAVEL: "Have you traveled out of the country in the last month?" (e.g., travel history, exposures)       no  Protocols used: MEDICATION QUESTION CALL-A-AH, COUGH - ACUTE NON-PRODUCTIVE-A-AH

## 2017-06-03 NOTE — Telephone Encounter (Signed)
Patient says her problems with her tongue are not any worse and she is not sure if she bit her tongue or not.  Patient was advised to stop Tamiflu and schedule an OV.  Patient says she was later advised that she could take the cough syrup and that has helped the cough tremendously and she has not had a fever in 2 days.  She will seek help if the symptoms worsen or persist.

## 2017-06-03 NOTE — Telephone Encounter (Signed)
Answer Assessment - Initial Assessment Questions 1. ONSET: "When did the swelling start?" (e.g., minutes, hours, days)    06/02/17 2. LOCATION: "What part of the tongue is swollen?" Left side 3. SEVERITY: "How swollen is it?"     moderate 4. CAUSE: "What do you think is causing the tongue swelling?" (e.g., hx of angioedema, allergies)     Not sure if from tamiflu or biting her tongue 5. RECURRENT SYMPTOM: "Have you had tongue swelling before?" If so, ask: "When was the last time?" "What happened that time?"     no 6. OTHER SYMPTOMS: "Do you have any other symptoms?" (e.g., difficulty breathing, facial swelling)     no 7. PREGNANCY: "Is there any chance you are pregnant?" "When was your last menstrual period?"     no  Protocols used: TONGUE SWELLING-A-AH

## 2017-06-03 NOTE — Telephone Encounter (Signed)
Did she bite her tongue? We are obligated to have her stop the tamiflu in the meantime but this doesn't seem to be related.   She needs someone to check her mouth/etc, needs OV.

## 2017-06-24 ENCOUNTER — Ambulatory Visit: Payer: Medicare Other | Admitting: Family Medicine

## 2017-06-24 ENCOUNTER — Encounter: Payer: Self-pay | Admitting: Family Medicine

## 2017-06-24 ENCOUNTER — Other Ambulatory Visit: Payer: Self-pay

## 2017-06-24 VITALS — BP 130/70 | HR 78 | Temp 98.2°F | Ht 62.0 in | Wt 142.0 lb

## 2017-06-24 DIAGNOSIS — M8580 Other specified disorders of bone density and structure, unspecified site: Secondary | ICD-10-CM | POA: Insufficient documentation

## 2017-06-24 DIAGNOSIS — E039 Hypothyroidism, unspecified: Secondary | ICD-10-CM

## 2017-06-24 DIAGNOSIS — I10 Essential (primary) hypertension: Secondary | ICD-10-CM | POA: Diagnosis not present

## 2017-06-24 DIAGNOSIS — E785 Hyperlipidemia, unspecified: Secondary | ICD-10-CM

## 2017-06-24 DIAGNOSIS — G479 Sleep disorder, unspecified: Secondary | ICD-10-CM | POA: Diagnosis not present

## 2017-06-24 NOTE — Assessment & Plan Note (Addendum)
At goal.  Check lab work today.  Continue current regimen.

## 2017-06-24 NOTE — Assessment & Plan Note (Signed)
Stable on Ambien.  Counseled that this could make her drowsy.  Advised against drinking alcohol with this.  She notes she rarely drinks alcohol.

## 2017-06-24 NOTE — Patient Instructions (Signed)
Nice to see you. You are due for pneumonia vaccine.  You can get this at your pharmacy. We will check lab work today and contact you with the results. Your DEXA scan revealed you had osteopenia.  You should be taking 800 international units of vitamin D over-the-counter.  You should also be taking calcium supplementation as well as dietary intake of calcium to equal 1200 mg daily of calcium intake.

## 2017-06-24 NOTE — Assessment & Plan Note (Signed)
Check direct LDL 

## 2017-06-24 NOTE — Assessment & Plan Note (Signed)
Noted on most recent DEXA scan.  Discussed adequate intake of calcium and vitamin D.

## 2017-06-24 NOTE — Assessment & Plan Note (Signed)
Stable on Synthroid.  Check TSH. 

## 2017-06-24 NOTE — Progress Notes (Signed)
  [name], MD Phone: [phone]  Tammy Dalton is a 72 y.o. female who presents today for f/u.  HYPERTENSION  Disease Monitoring  Home BP Monitoring 130/70  Chest pain- no    Dyspnea- no Medications  Compliance-  Taking lisinopril/hctz.  Edema- no  HYPOTHYROIDISM Disease Monitoring Weight changes: no  Skin Changes: no Heat/Cold intolerance: no  Medication Monitoring Compliance:  Taking synthroid   Last TSH:   Lab Results  Component Value Date   TSH 1.19 12/24/2016   Insomnia: Takes Ambien nightly.  Notes this is somewhat helpful.  Occasionally she has more trouble falling asleep.  No caffeine or alcohol intake.  No drowsiness or nighttime activities with the Ambien.  Typically gets 6-7 hours of sleep.  She was evaluated in the emergency department after slipping and falling and hitting her head.  She had staples that were removed in our office by 1 of the nurse practitioners.  She had no loss of consciousness.  She has been doing well since then.  Social History   Tobacco Use  Smoking Status Former Smoker  . Types: Cigarettes  Smokeless Tobacco Never Used     ROS see history of present illness  Objective  Physical Exam Vitals:   06/24/17 1546  BP: 130/70  Pulse: 78  Temp: 98.2 F (36.8 C)  SpO2: 97%    BP Readings from Last 3 Encounters:  06/24/17 130/70  05/31/17 130/70  04/18/17 122/70   Wt Readings from Last 3 Encounters:  06/24/17 142 lb (64.4 kg)  05/31/17 140 lb 12 oz (63.8 kg)  04/18/17 139 lb 2 oz (63.1 kg)    Physical Exam  Constitutional: No distress.  HENT:  Head:    Cardiovascular: Normal rate, regular rhythm and normal heart sounds.  Pulmonary/Chest: Effort normal and breath sounds normal.  Musculoskeletal: She exhibits no edema.  Neurological: She is alert. Gait normal.  Skin: Skin is warm and dry. She is not diaphoretic.     Assessment/Plan: Please see individual problem list.  Hypertension At goal.  Check lab work  today.  Continue current regimen.  Hypothyroidism Stable on Synthroid.  Check TSH.  Sleeping difficulty Stable on Ambien.  Counseled that this could make her drowsy.  Advised against drinking alcohol with this.  She notes she rarely drinks alcohol.  Hyperlipidemia Check direct LDL.  Osteopenia Noted on most recent DEXA scan.  Discussed adequate intake of calcium and vitamin D.  Patient is doing well status post fall and head injury earlier this year.  Scar is well-healed.  She has had no adverse long-term effects.  She will monitor.  Health maintenance: Advised her that she is due for pneumonia vaccination.  She will check with her pharmacy to get this.  Orders Placed This Encounter  Procedures  . Comp Met (CMET)  . LDL cholesterol, direct  . TSH    No orders of the defined types were placed in this encounter.    [name], MD [place]

## 2017-06-25 LAB — COMPREHENSIVE METABOLIC PANEL
ALBUMIN: 4.2 g/dL (ref 3.5–5.2)
ALK PHOS: 98 U/L (ref 39–117)
ALT: 38 U/L — AB (ref 0–35)
AST: 30 U/L (ref 0–37)
BILIRUBIN TOTAL: 0.5 mg/dL (ref 0.2–1.2)
BUN: 16 mg/dL (ref 6–23)
CO2: 27 mEq/L (ref 19–32)
Calcium: 9.4 mg/dL (ref 8.4–10.5)
Chloride: 101 mEq/L (ref 96–112)
Creatinine, Ser: 0.96 mg/dL (ref 0.40–1.20)
GFR: 60.96 mL/min (ref >60.00)
GLUCOSE: 79 mg/dL (ref 70–99)
POTASSIUM: 4.1 meq/L (ref 3.5–5.1)
SODIUM: 138 meq/L (ref 135–145)
TOTAL PROTEIN: 7.1 g/dL (ref 6.0–8.3)

## 2017-06-25 LAB — TSH: TSH: 4.7 u[IU]/mL — ABNORMAL HIGH (ref 0.35–4.50)

## 2017-06-25 LAB — LDL CHOLESTEROL, DIRECT: Direct LDL: 50 mg/dL

## 2017-06-26 ENCOUNTER — Other Ambulatory Visit: Payer: Self-pay

## 2017-06-26 ENCOUNTER — Telehealth: Payer: Self-pay

## 2017-06-26 NOTE — Telephone Encounter (Signed)
Patient states she is taking tumeric, calcium and vitamin d. Chart updated.

## 2017-06-28 ENCOUNTER — Other Ambulatory Visit: Payer: Self-pay | Admitting: *Deleted

## 2017-06-28 DIAGNOSIS — R7989 Other specified abnormal findings of blood chemistry: Secondary | ICD-10-CM

## 2017-06-28 DIAGNOSIS — R945 Abnormal results of liver function studies: Secondary | ICD-10-CM

## 2017-06-28 DIAGNOSIS — E039 Hypothyroidism, unspecified: Secondary | ICD-10-CM

## 2017-06-28 NOTE — Progress Notes (Unsigned)
See lab note 06/27/17 labs ordered.

## 2017-07-20 ENCOUNTER — Other Ambulatory Visit: Payer: Self-pay | Admitting: Family Medicine

## 2017-07-23 DIAGNOSIS — Z1231 Encounter for screening mammogram for malignant neoplasm of breast: Secondary | ICD-10-CM | POA: Diagnosis not present

## 2017-07-23 LAB — HM MAMMOGRAPHY

## 2017-07-24 ENCOUNTER — Encounter: Payer: Self-pay | Admitting: Family Medicine

## 2017-08-13 ENCOUNTER — Other Ambulatory Visit (INDEPENDENT_AMBULATORY_CARE_PROVIDER_SITE_OTHER): Payer: Medicare Other

## 2017-08-13 DIAGNOSIS — R7989 Other specified abnormal findings of blood chemistry: Secondary | ICD-10-CM

## 2017-08-13 DIAGNOSIS — R945 Abnormal results of liver function studies: Secondary | ICD-10-CM

## 2017-08-13 DIAGNOSIS — E039 Hypothyroidism, unspecified: Secondary | ICD-10-CM

## 2017-08-13 LAB — HEPATIC FUNCTION PANEL
ALBUMIN: 4.1 g/dL (ref 3.5–5.2)
ALT: 32 U/L (ref 0–35)
AST: 24 U/L (ref 0–37)
Alkaline Phosphatase: 73 U/L (ref 39–117)
BILIRUBIN TOTAL: 0.5 mg/dL (ref 0.2–1.2)
Bilirubin, Direct: 0.1 mg/dL (ref 0.0–0.3)
Total Protein: 6.6 g/dL (ref 6.0–8.3)

## 2017-08-13 LAB — TSH: TSH: 1.47 u[IU]/mL (ref 0.35–4.50)

## 2017-08-20 ENCOUNTER — Other Ambulatory Visit: Payer: Self-pay | Admitting: Family Medicine

## 2017-08-21 NOTE — Telephone Encounter (Signed)
Last OV 06/24/17 last filled 04/04/17 30 3rf

## 2017-08-27 ENCOUNTER — Ambulatory Visit: Payer: Medicare Other | Admitting: Family Medicine

## 2017-09-23 ENCOUNTER — Encounter: Payer: Self-pay | Admitting: Family Medicine

## 2017-09-23 ENCOUNTER — Other Ambulatory Visit: Payer: Self-pay | Admitting: Family Medicine

## 2017-09-23 ENCOUNTER — Ambulatory Visit: Payer: Medicare Other | Admitting: Family Medicine

## 2017-09-23 DIAGNOSIS — G479 Sleep disorder, unspecified: Secondary | ICD-10-CM

## 2017-09-23 DIAGNOSIS — C4490 Unspecified malignant neoplasm of skin, unspecified: Secondary | ICD-10-CM | POA: Diagnosis not present

## 2017-09-23 DIAGNOSIS — I10 Essential (primary) hypertension: Secondary | ICD-10-CM

## 2017-09-23 NOTE — Patient Instructions (Signed)
Nice to see you. Please try to not watch TV in bed.  I think that will help you sleep better.

## 2017-09-23 NOTE — Assessment & Plan Note (Signed)
Adequately controlled.  Continue current regimen. 

## 2017-09-23 NOTE — Progress Notes (Signed)
  [name], MD Phone: [phone]  Tammy Dalton is a 72 y.o. female who presents today for f/u.  CC: htn,insomnia, history of skin cancer  HYPERTENSION  Disease Monitoring  Home BP Monitoring 166-060 systolic Chest pain- no    Dyspnea- no Medications  Compliance-  Taking lisinopril/hctz.   Edema- no  Insomnia: Patient notes she continues to watch TV while in bed.  She will close her eyes and just listen to the news.  She does not drink caffeine at night.  No alcohol use.  She takes Ambien and sometimes it works well and she will fall asleep right away and other times it will take 1.5 hours to fall asleep.  She notes no drowsiness with this.  She gets 6 hours of sleep nightly.  She has a history of skin cancer and she follows with Dr. Evorn Gong yearly for this.  They have not found any new spots.  She has not noticed any new skin lesions.    Social History   Tobacco Use  Smoking Status Former Smoker  . Types: Cigarettes  Smokeless Tobacco Never Used     ROS see history of present illness  Objective  Physical Exam Vitals:   09/23/17 1546  BP: 136/80  Pulse: 66  Temp: 98.4 F (36.9 C)  SpO2: 98%    BP Readings from Last 3 Encounters:  09/23/17 136/80  06/24/17 130/70  05/31/17 130/70   Wt Readings from Last 3 Encounters:  09/23/17 139 lb 6.4 oz (63.2 kg)  06/24/17 142 lb (64.4 kg)  05/31/17 140 lb 12 oz (63.8 kg)    Physical Exam  Constitutional: No distress.  Cardiovascular: Normal rate, regular rhythm and normal heart sounds.  Pulmonary/Chest: Effort normal and breath sounds normal.  Musculoskeletal: She exhibits no edema.  Neurological: She is alert.  Skin: Skin is warm and dry. She is not diaphoretic.     Assessment/Plan: Please see individual problem list.  Hypertension Adequately controlled.  Continue current regimen.  Skin cancer She will continue to follow with dermatology.  Sleeping difficulty She will continue Ambien.  I encouraged  her not to have the TV on while she is in bed.    No orders of the defined types were placed in this encounter.   No orders of the defined types were placed in this encounter.    [name], MD [place]

## 2017-09-23 NOTE — Assessment & Plan Note (Signed)
She will continue to follow with dermatology. 

## 2017-09-23 NOTE — Assessment & Plan Note (Signed)
She will continue Ambien.  I encouraged her not to have the TV on while she is in bed.

## 2017-12-16 ENCOUNTER — Other Ambulatory Visit: Payer: Self-pay

## 2017-12-16 ENCOUNTER — Other Ambulatory Visit: Payer: Self-pay | Admitting: Family Medicine

## 2017-12-16 NOTE — Patient Outreach (Signed)
[facility]) Care Management  12/16/2017  PECOLIA MARANDO 1946/01/03 [id number]   Medication Adherence call to Mrs. Tammy Dalton left a message for patient to call back patient is due on Lisinopril / Hctz 20/25 patients telephone number is disconnected under [facility]. Mrs. Duffin is showing past due under [facility].  [facility] Management Direct Dial [phone]  Fax [phone] [email or web address]

## 2017-12-25 ENCOUNTER — Ambulatory Visit: Payer: Medicare Other

## 2017-12-28 ENCOUNTER — Other Ambulatory Visit: Payer: Self-pay | Admitting: Family Medicine

## 2017-12-30 ENCOUNTER — Ambulatory Visit: Payer: Medicare Other

## 2017-12-30 NOTE — Telephone Encounter (Signed)
Pt calling to check status of refill. Pt states that she has two tablets left.

## 2017-12-30 NOTE — Telephone Encounter (Signed)
Requested medication (s) are due for refill today: yes  Requested medication (s) are on the active medication list: yes  Last refill:  11/28/17  Future visit scheduled: yes  Notes to clinic:  undelegated    Requested Prescriptions  Pending Prescriptions Disp Refills   zolpidem (AMBIEN) 5 MG tablet [Pharmacy Med Name: ZOLPIDEM 5MG         TAB] 30 tablet 3    Sig: TAKE 1 TABLET BY MOUTH AT BEDTIME AS NEEDED FOR SLEEP     Not Delegated - Psychiatry:  Anxiolytics/Hypnotics Failed - 12/30/2017  2:16 PM      Failed - This refill cannot be delegated      Failed - Urine Drug Screen completed in last 360 days.      Passed - Valid encounter within last 6 months    Recent Outpatient Visits          3 months ago Essential hypertension   [place], MD   6 months ago Hyperlipidemia, unspecified hyperlipidemia type   [facility], Angela Adam, MD   7 months ago Cough   [place] [facility] [place], MD   8 months ago Laceration of scalp, subsequent encounter   [facility] [place], [place]   1 year ago Essential hypertension   [place], Angela Adam, MD      Future Appointments            In 2 weeks O'Brien-Blaney, Bryson Corona, LPN [place], [place]   In 2 weeks Caryl Bis, Angela Adam, MD [facility], [facility]

## 2017-12-31 NOTE — Telephone Encounter (Signed)
Sent to PCP to advise 

## 2017-12-31 NOTE — Telephone Encounter (Signed)
Pt calling to check status. Please advise she has none left

## 2018-01-01 NOTE — Telephone Encounter (Signed)
Sent to pharmacy.  Controlled substance database reviewed. 

## 2018-01-01 NOTE — Telephone Encounter (Signed)
Pt called back again about the refill of the Zolpidem

## 2018-01-13 ENCOUNTER — Encounter: Payer: Self-pay | Admitting: Family Medicine

## 2018-01-13 ENCOUNTER — Ambulatory Visit: Payer: Medicare Other

## 2018-01-13 ENCOUNTER — Ambulatory Visit (INDEPENDENT_AMBULATORY_CARE_PROVIDER_SITE_OTHER): Payer: Medicare Other | Admitting: Family Medicine

## 2018-01-13 VITALS — BP 130/70 | HR 73 | Temp 98.2°F | Resp 18 | Ht 62.0 in | Wt 141.8 lb

## 2018-01-13 VITALS — BP 130/70 | HR 73 | Temp 98.2°F | Resp 15 | Ht 62.0 in | Wt 141.8 lb

## 2018-01-13 DIAGNOSIS — G479 Sleep disorder, unspecified: Secondary | ICD-10-CM | POA: Diagnosis not present

## 2018-01-13 DIAGNOSIS — I10 Essential (primary) hypertension: Secondary | ICD-10-CM | POA: Diagnosis not present

## 2018-01-13 DIAGNOSIS — E039 Hypothyroidism, unspecified: Secondary | ICD-10-CM

## 2018-01-13 DIAGNOSIS — E785 Hyperlipidemia, unspecified: Secondary | ICD-10-CM | POA: Diagnosis not present

## 2018-01-13 DIAGNOSIS — Z0000 Encounter for general adult medical examination without abnormal findings: Secondary | ICD-10-CM | POA: Diagnosis not present

## 2018-01-13 NOTE — Assessment & Plan Note (Signed)
Stable on Synthroid.  Check TSH.

## 2018-01-13 NOTE — Patient Instructions (Signed)
Nice to see you. We will check lab work today and contact you with the results. 

## 2018-01-13 NOTE — Patient Instructions (Addendum)
  Ms. Soltys , Thank you for taking time to come for your Medicare Wellness Visit. I appreciate your ongoing commitment to your health goals. Please review the following plan we discussed and let me know if I can assist you in the future.   Follow up as needed.    Bring a copy of your [place] and/or Living Will to be scanned into chart.  Have a great day! These are the goals we discussed: Goals      Patient Stated   . Maintain healthy lifestyle (pt-stated)       This is a list of the screening recommended for you and due dates:  Health Maintenance  Topic Date Due  . Pneumonia vaccines (2 of 2 - PPSV23) 06/29/2018  . Mammogram  07/24/2019  . Colon Cancer Screening  04/24/2026  . Tetanus Vaccine  04/14/2027  . Flu Shot  Completed  . DEXA scan (bone density measurement)  Completed  .  Hepatitis C: One time screening is recommended by Center for Disease Control  (CDC) for  adults born from [birth year] through 1964.   Completed

## 2018-01-13 NOTE — Assessment & Plan Note (Signed)
Stable on Ambien.  She will continue this.

## 2018-01-13 NOTE — Assessment & Plan Note (Signed)
At goal.  Continue current regimen.  Check lab work.

## 2018-01-13 NOTE — Progress Notes (Signed)
  [name], MD Phone: [phone]  Tammy Dalton is a 72 y.o. female who presents today for f/u.  CC: htn, hypothyroidism, insomnia  HYPOTHYROIDISM Disease Monitoring Weight changes: no  Skin Changes: no Heat/Cold intolerance: no  Medication Monitoring Compliance:  Taking synthroid   Last TSH:   Lab Results  Component Value Date   TSH 1.47 08/13/2017   HYPERTENSION  Disease Monitoring  Home BP Monitoring 116-125/70s Chest pain- no    Dyspnea- no Medications  Compliance-  Taking lisinopril, HCTZ.  Edema- no  Insomnia: Taking Ambien nightly.  She missed one night and could not sleep.  No drowsiness.  No alcohol use with this.  She has stopped watching TV before bed.    Social History   Tobacco Use  Smoking Status Former Smoker  . Types: Cigarettes  Smokeless Tobacco Never Used     ROS see history of present illness  Objective  Physical Exam Vitals:   01/13/18 1521  BP: 130/70  Pulse: 73  Resp: 18  Temp: 98.2 F (36.8 C)  SpO2: 97%    BP Readings from Last 3 Encounters:  01/13/18 130/70  01/13/18 130/70  09/23/17 136/80   Wt Readings from Last 3 Encounters:  01/13/18 141 lb 12.8 oz (64.3 kg)  01/13/18 141 lb 12.8 oz (64.3 kg)  09/23/17 139 lb 6.4 oz (63.2 kg)    Physical Exam  Constitutional: No distress.  Cardiovascular: Normal rate, regular rhythm and normal heart sounds.  Pulmonary/Chest: Effort normal and breath sounds normal.  Musculoskeletal: She exhibits no edema.  Neurological: She is alert.  Skin: Skin is warm and dry. She is not diaphoretic.     Assessment/Plan: Please see individual problem list.  Hypertension At goal.  Continue current regimen.  Check lab work.  Hypothyroidism Stable on Synthroid.  Check TSH.  Sleeping difficulty Stable on Ambien.  She will continue this.   Orders Placed This Encounter  Procedures  . TSH  . Comp Met (CMET)  . Lipid panel    No orders of the defined types were placed in this  encounter.    [name], MD [place]

## 2018-01-13 NOTE — Progress Notes (Signed)
Subjective:   Tammy Dalton is a 72 y.o. female who presents for Medicare Annual (Subsequent) preventive examination.  Review of Systems:  No ROS.  Medicare Wellness Visit. Additional risk factors are reflected in the social history. Cardiac Risk Factors include: advanced age (>78men, >78 women);hypertension     Objective:     Vitals: BP 130/70 (BP Location: Left Arm, Patient Position: Sitting, Cuff Size: Normal)   Pulse 73   Temp 98.2 F (36.8 C) (Oral)   Resp 15   Ht 5\' 2"  (1.575 m)   Wt 141 lb 12.8 oz (64.3 kg)   SpO2 97%   BMI 25.94 kg/m   Body mass index is 25.94 kg/m.  Advanced Directives 01/13/2018 04/13/2017 12/24/2016 04/24/2016  Does Patient Have a Medical Advance Directive? Yes No Yes Yes  Type of Paramedic of [place];Living will - [place];Living will [place];Living will  Does patient want to make changes to medical advance directive? No - Patient declined - No - Patient declined -  Copy of [place] in Chart? No - copy requested - No - copy requested No - copy requested  Would patient like information on creating a medical advance directive? - No - Patient declined - -    Tobacco Social History   Tobacco Use  Smoking Status Former Smoker  . Types: Cigarettes  Smokeless Tobacco Never Used     Counseling given: Not Answered   Clinical Intake:  Pre-visit preparation completed: Yes  Pain : No/denies pain     Nutritional Status: BMI 25 -29 Overweight Diabetes: No  How often do you need to have someone help you when you read instructions, pamphlets, or other written materials from your doctor or pharmacy?: 1 - Never  Interpreter Needed?: No     Past Medical History:  Diagnosis Date  . Hyperlipidemia   . Hypertension   . Thyroid disease    Past Surgical History:  Procedure Laterality Date  . anal sphincterotomy and fissurectomy  2009  . COLONOSCOPY   05/24/2011   Dr. Jill Side done at [facility]  . COLONOSCOPY  05/20/2000   Dr. Jamal Collin; Sharp bend at 50 cm. Scope could not be advanced any further even with glucagon.  . COLONOSCOPY WITH PROPOFOL N/A 04/24/2016   Procedure: COLONOSCOPY WITH PROPOFOL;  Surgeon: Christene Lye, MD;  Location: [facility] ENDOSCOPY;  Service: Endoscopy;  Laterality: N/A;  . EYE SURGERY Bilateral 2010   cataract surgery.   . TONSILLECTOMY     Family History  Problem Relation Age of Onset  . Depression Mother   . Heart disease Father   . Early death Sister   . Alcohol abuse Brother   . Stroke Paternal Grandfather    Social History   Socioeconomic History  . Marital status: Single    Spouse name: Not on file  . Number of children: Not on file  . Years of education: Not on file  . Highest education level: Not on file  Occupational History  . Not on file  Social Needs  . Financial resource strain: Not hard at all  . Food insecurity:    Worry: Never true    Inability: Never true  . Transportation needs:    Medical: No    Non-medical: No  Tobacco Use  . Smoking status: Former Smoker    Types: Cigarettes  . Smokeless tobacco: Never Used  Substance and Sexual Activity  . Alcohol use:  No  . Drug use: No  . Sexual activity: Never  Lifestyle  . Physical activity:    Days per week: Not on file    Minutes per session: Not on file  . Stress: Not at all  Relationships  . Social connections:    Talks on phone: Not on file    Gets together: Not on file    Attends religious service: Not on file    Active member of club or organization: Not on file    Attends meetings of clubs or organizations: Not on file    Relationship status: Not on file  Other Topics Concern  . Not on file  Social History Narrative  . Not on file    Outpatient Encounter Medications as of 01/13/2018  Medication Sig  . aspirin EC 81 MG tablet Take 81 mg by mouth daily.  . Calcium  Carbonate-Vitamin D (CALCIUM PLUS VITAMIN D PO) Take by mouth.  . cholecalciferol (VITAMIN D) 1000 units tablet Take 1,000 Units by mouth daily.  . ferrous sulfate 325 (65 FE) MG tablet Take 325 mg by mouth daily with breakfast.  . levothyroxine (SYNTHROID, LEVOTHROID) 100 MCG tablet TAKE 1 TABLET BY MOUTH ONCE DAILY BEFORE BREAKFAST  . lisinopril-hydrochlorothiazide (PRINZIDE,ZESTORETIC) 20-25 MG tablet TAKE 1 TABLET BY MOUTH ONCE DAILY  . niacin 500 MG tablet Take 500 mg by mouth at bedtime.  . Omega-3 Fatty Acids (FISH OIL) 1200 MG CAPS Take by mouth.  . simvastatin (ZOCOR) 40 MG tablet TAKE 1 TABLET BY MOUTH ONCE DAILY AT  6PM  . Turmeric Curcumin 500 MG CAPS Take by mouth.  . zolpidem (AMBIEN) 5 MG tablet TAKE 1 TABLET BY MOUTH AT BEDTIME AS NEEDED FOR SLEEP   No facility-administered encounter medications on file as of 01/13/2018.     Activities of Daily Living In your present state of health, do you have any difficulty performing the following activities: 01/13/2018  Hearing? N  Vision? N  Difficulty concentrating or making decisions? N  Walking or climbing stairs? N  Dressing or bathing? N  Doing errands, shopping? N  Preparing Food and eating ? N  Using the Toilet? N  In the past six months, have you accidently leaked urine? N  Do you have problems with loss of bowel control? N  Managing your Medications? N  Managing your Finances? N  Housekeeping or managing your Housekeeping? N  Some recent data might be hidden    Patient Care Team: Leone Haven, MD as PCP - General (Family Medicine) Christene Lye, MD (General Surgery)    Assessment:   This is a routine wellness examination for [name]. The goal of the wellness visit is to assist the patient how to close the gaps in care and create a preventative care plan for the patient.   The roster of all physicians providing medical care to patient is listed in the Snapshot section of the chart.  Osteopenia.  Taking calcium VIT D as appropriate/Osteoporosis risk reviewed.    Safety issues reviewed; Smoke and carbon monoxide detectors in the home. No firearms in the home. Wears seatbelts when driving or riding with others. No violence in the home.  They do not have excessive sun exposure.  Discussed the need for sun protection: hats, long sleeves and the use of sunscreen if there is significant sun exposure.  Patient is alert, normal appearance, oriented to person/place/and time.  Correctly identified the president of the [place] and recalls of 3/3 words. Performs simple calculations  and can read correct time from watch face.  Displays appropriate judgement.  No new identified risk were noted.  No failures at ADL's or IADL's.    Works 3 days weekly at [employer]. Stays active with walking, golf and plying softball.   BMI- discussed the importance of a healthy diet, water intake and the benefits of aerobic exercise.  24 hour diet recall: Regular diet  Dental- every 4 months.  Sleep patterns- Sleeping better. Taking ambien as directed.   Health maintenance gaps- closed.  Exercise Activities and Dietary recommendations Current Exercise Habits: Home exercise routine, Intensity: Mild  Goals      Patient Stated   . Maintain healthy lifestyle (pt-stated)       Fall Risk Fall Risk  01/13/2018 12/24/2016 12/24/2016  Falls in the past year? Yes No No  Number falls in past yr: 1 - -  Injury with Fall? Yes - -  Comment She hurt her head and sought medical attention after slipping on wet grass, followed up with pcp.  - -  Follow up Falls prevention discussed - -   Depression Screen PHQ 2/9 Scores 01/13/2018 12/24/2016 12/24/2016  PHQ - 2 Score 0 0 0     Cognitive Function MMSE - Mini Mental State Exam 01/13/2018 12/24/2016  Orientation to time 5 5  Orientation to Place 5 5  Registration 3 3  Attention/ Calculation 5 5  Recall 3 3  Language- name 2 objects 2 2  Language- repeat 1 1    Language- follow 3 step command 3 3  Language- read & follow direction 1 1  Write a sentence 1 1  Copy design 1 1  Total score 30 30        Immunization History  Administered Date(s) Administered  . Influenza, High Dose Seasonal PF 12/24/2016  . Influenza-Unspecified 11/25/2015, 12/02/2017  . Pneumococcal Conjugate-13 06/28/2017  . Pneumococcal-Unspecified 05/25/2011  . Tdap 08/01/2015, 04/13/2017  . Zoster 03/27/2007  . Zoster Recombinat (Shingrix) 06/13/2017, 09/13/2017    Screening Tests Health Maintenance  Topic Date Due  . PNA vac Low Risk Adult (2 of 2 - PPSV23) 06/29/2018  . MAMMOGRAM  07/24/2019  . COLONOSCOPY  04/24/2026  . TETANUS/TDAP  04/14/2027  . INFLUENZA VACCINE  Completed  . DEXA SCAN  Completed  . Hepatitis C Screening  Completed      Plan:    End of life planning; Advance aging; Advanced directives discussed. Copy of current HCPOA/Living Will requested.    I have personally reviewed and noted the following in the patient's chart:   . Medical and social history . Use of alcohol, tobacco or illicit drugs  . Current medications and supplements . Functional ability and status . Nutritional status . Physical activity . Advanced directives . List of other physicians . Hospitalizations, surgeries, and ER visits in previous 12 months . Vitals . Screenings to include cognitive, depression, and falls . Referrals and appointments  In addition, I have reviewed and discussed with patient certain preventive protocols, quality metrics, and best practice recommendations. A written personalized care plan for preventive services as well as general preventive health recommendations were provided to patient.     Varney Biles, LPN  64/15/8309

## 2018-01-14 LAB — COMPREHENSIVE METABOLIC PANEL
ALT: 24 U/L (ref 0–35)
AST: 24 U/L (ref 0–37)
Albumin: 4.8 g/dL (ref 3.5–5.2)
Alkaline Phosphatase: 72 U/L (ref 39–117)
BUN: 19 mg/dL (ref 6–23)
CHLORIDE: 101 meq/L (ref 96–112)
CO2: 29 meq/L (ref 19–32)
CREATININE: 1.11 mg/dL (ref 0.40–1.20)
Calcium: 9.7 mg/dL (ref 8.4–10.5)
GFR: 51.48 mL/min — ABNORMAL LOW (ref >60.00)
Glucose, Bld: 84 mg/dL (ref 70–99)
Potassium: 3.7 mEq/L (ref 3.5–5.1)
SODIUM: 140 meq/L (ref 135–145)
Total Bilirubin: 0.6 mg/dL (ref 0.2–1.2)
Total Protein: 7 g/dL (ref 6.0–8.3)

## 2018-01-14 LAB — LIPID PANEL
CHOL/HDL RATIO: 5
Cholesterol: 232 mg/dL — ABNORMAL HIGH (ref 0–200)
HDL: 50.4 mg/dL (ref >39.00)
NONHDL: 181.88
TRIGLYCERIDES: 321 mg/dL — AB (ref 0.0–149.0)
VLDL: 64.2 mg/dL — ABNORMAL HIGH (ref 0.0–40.0)

## 2018-01-14 LAB — LDL CHOLESTEROL, DIRECT: Direct LDL: 73 mg/dL

## 2018-01-14 LAB — TSH: TSH: 6.48 u[IU]/mL — AB (ref 0.35–4.50)

## 2018-01-15 NOTE — Progress Notes (Signed)
I have reviewed the above note and agree.  Corrisa Gibby, M.D.  

## 2018-01-20 ENCOUNTER — Telehealth: Payer: Self-pay

## 2018-01-20 NOTE — Telephone Encounter (Signed)
See result note prior to calling patient.

## 2018-01-20 NOTE — Telephone Encounter (Signed)
Copied from [place] [id number]. Topic: General - Other >> Jan 20, 2018  8:25 AM Judyann Munson wrote: Reason for CRM: Pt is requesting a call back from Cannon Kettle  in regards to her labs and changing medicine. Please advise

## 2018-01-21 ENCOUNTER — Other Ambulatory Visit: Payer: Self-pay | Admitting: Family Medicine

## 2018-01-21 DIAGNOSIS — E039 Hypothyroidism, unspecified: Secondary | ICD-10-CM

## 2018-01-21 DIAGNOSIS — E785 Hyperlipidemia, unspecified: Secondary | ICD-10-CM

## 2018-01-21 MED ORDER — ROSUVASTATIN CALCIUM 20 MG PO TABS: 20.0000 mg | ORAL_TABLET | Freq: Every day | ORAL | 3 refills | Status: DC

## 2018-01-21 NOTE — Telephone Encounter (Signed)
Called and spoke with patient about lab results. Pt advised and voiced understanding. Declined to start Crestor at this time.

## 2018-02-11 ENCOUNTER — Other Ambulatory Visit (INDEPENDENT_AMBULATORY_CARE_PROVIDER_SITE_OTHER): Payer: Medicare Other

## 2018-02-11 DIAGNOSIS — E785 Hyperlipidemia, unspecified: Secondary | ICD-10-CM | POA: Diagnosis not present

## 2018-02-11 DIAGNOSIS — E039 Hypothyroidism, unspecified: Secondary | ICD-10-CM | POA: Diagnosis not present

## 2018-02-11 LAB — HEPATIC FUNCTION PANEL
ALT: 26 U/L (ref 0–35)
AST: 23 U/L (ref 0–37)
Albumin: 4.2 g/dL (ref 3.5–5.2)
Alkaline Phosphatase: 62 U/L (ref 39–117)
Bilirubin, Direct: 0.1 mg/dL (ref 0.0–0.3)
TOTAL PROTEIN: 6.6 g/dL (ref 6.0–8.3)
Total Bilirubin: 0.5 mg/dL (ref 0.2–1.2)

## 2018-02-11 LAB — LDL CHOLESTEROL, DIRECT: Direct LDL: 54 mg/dL

## 2018-02-11 LAB — TSH: TSH: 1.05 u[IU]/mL (ref 0.35–4.50)

## 2018-04-01 ENCOUNTER — Other Ambulatory Visit: Payer: Self-pay | Admitting: Family Medicine

## 2018-04-01 DIAGNOSIS — H61031 Chondritis of right external ear: Secondary | ICD-10-CM | POA: Diagnosis not present

## 2018-04-01 DIAGNOSIS — D2262 Melanocytic nevi of left upper limb, including shoulder: Secondary | ICD-10-CM | POA: Diagnosis not present

## 2018-04-01 DIAGNOSIS — D2261 Melanocytic nevi of right upper limb, including shoulder: Secondary | ICD-10-CM | POA: Diagnosis not present

## 2018-04-01 DIAGNOSIS — Z85828 Personal history of other malignant neoplasm of skin: Secondary | ICD-10-CM | POA: Diagnosis not present

## 2018-04-01 DIAGNOSIS — L570 Actinic keratosis: Secondary | ICD-10-CM | POA: Diagnosis not present

## 2018-04-01 DIAGNOSIS — L820 Inflamed seborrheic keratosis: Secondary | ICD-10-CM | POA: Diagnosis not present

## 2018-04-30 DIAGNOSIS — Z961 Presence of intraocular lens: Secondary | ICD-10-CM | POA: Diagnosis not present

## 2018-05-05 ENCOUNTER — Other Ambulatory Visit: Payer: Self-pay | Admitting: Family Medicine

## 2018-05-05 NOTE — Telephone Encounter (Signed)
Last OV 01/13/2018   Last refilled 01/01/2018 disp 30 with 3 refills   Sent to PCP for approval

## 2018-05-05 NOTE — Telephone Encounter (Signed)
Controlled substance database reviewed. Sent to pharmacy.   

## 2018-06-02 ENCOUNTER — Other Ambulatory Visit: Payer: Self-pay | Admitting: Family Medicine

## 2018-06-03 NOTE — Telephone Encounter (Signed)
Refilled: 05/05/2018 Last OV: 01/13/2018 Next OV: 07/16/2018

## 2018-06-10 ENCOUNTER — Encounter: Payer: Self-pay | Admitting: Family Medicine

## 2018-06-10 ENCOUNTER — Ambulatory Visit (INDEPENDENT_AMBULATORY_CARE_PROVIDER_SITE_OTHER): Payer: Medicare Other | Admitting: Family Medicine

## 2018-06-10 ENCOUNTER — Other Ambulatory Visit: Payer: Self-pay

## 2018-06-10 VITALS — BP 118/64 | HR 84 | Temp 98.0°F | Resp 18 | Ht 62.0 in | Wt 134.0 lb

## 2018-06-10 DIAGNOSIS — L03114 Cellulitis of left upper limb: Secondary | ICD-10-CM

## 2018-06-10 MED ORDER — CEPHALEXIN 500 MG PO CAPS: 500.0000 mg | ORAL_CAPSULE | Freq: Four times a day (QID) | ORAL | 0 refills | Status: DC

## 2018-06-10 NOTE — Progress Notes (Signed)
   Subjective:    Patient ID: Tammy Dalton, female    DOB: 04-Oct-1945, 73 y.o.   MRN: [id number]  HPI   Patient presents to clinic due to redness and warmth of skin on left elbow, and some minor elbow swelling.  Does not recall hitting elbow or injuring elbow.  Denies fever or chills.  Reports no history of past issues with skin infections or cellulitis.  Patient Active Problem List   Diagnosis Date Noted  . Osteopenia 06/24/2017  . Hyperlipidemia 06/20/2016  . Hypertension 06/20/2016  . Hypothyroidism 06/20/2016  . Sleeping difficulty 06/20/2016  . Skin cancer 06/20/2016   Social History   Tobacco Use  . Smoking status: Former Smoker    Types: Cigarettes  . Smokeless tobacco: Never Used  Substance Use Topics  . Alcohol use: No     Review of Systems  Constitutional: Negative for chills, fatigue and fever.  HENT: Negative for congestion, ear pain, sinus pain and sore throat.   Eyes: Negative.   Respiratory: Negative for cough, shortness of breath and wheezing.   Cardiovascular: Negative for chest pain, palpitations and leg swelling.  Gastrointestinal: Negative for abdominal pain, diarrhea, nausea and vomiting.  Genitourinary: Negative for dysuria, frequency and urgency.  Musculoskeletal: Negative for arthralgias and myalgias.  Skin: +swelling and redness left elbow Neurological: Negative for syncope, light-headedness and headaches.  Psychiatric/Behavioral: The patient is not nervous/anxious.       Objective:   Physical Exam Vitals signs and nursing note reviewed.  Constitutional:      General: She is not in acute distress.    Appearance: She is not toxic-appearing.  HENT:     Head: Normocephalic and atraumatic.  Cardiovascular:     Rate and Rhythm: Normal rate and regular rhythm.  Pulmonary:     Effort: Pulmonary effort is normal. No respiratory distress.     Breath sounds: Normal breath sounds.  Musculoskeletal:        General: Swelling (mild swelling left  elbow) present.  Skin:    General: Skin is warm and dry.     Findings: Erythema present.          Comments: Location of redness and minor swelling indicated by red mark on diagram  Neurological:     Mental Status: She is alert and oriented to person, place, and time.  Psychiatric:        Mood and Affect: Mood normal.        Behavior: Behavior normal.    Vitals:   06/10/18 1308  BP: 118/64  Pulse: 84  Resp: 18  Temp: 98 F (36.7 C)  SpO2: 93%      Assessment & Plan:    Cellulitis left upper extremity-patient will take Keflex 4 times daily for 10 days.  Also advised to wrap elbow and Ace wrap to help reduce swelling.  Patient will monitor area for any worsening redness or streaking.  Also advised to monitor self for any development of fever.  Advised to call office right away if fever or worsening redness occur.  3 inch Ace wrap applied to patient's elbow in clinic by FNP.  Offered follow-up appointment to recheck on cellulitis, but patient would prefer not to have to return to clinic so soon due to coronavirus concerns.  Patient will keep a close watch over her cellulitis and will call us right away if it worsens.

## 2018-06-11 ENCOUNTER — Ambulatory Visit: Payer: Self-pay | Admitting: Family Medicine

## 2018-06-11 NOTE — Telephone Encounter (Signed)
Pt called [facility] I want to play golf tomorrow.   Is it ok?  She was seen by Philis Nettle, FNP 06/10/2018 for cellulitis of left upper extremity (elbow).  I let her know someone would call her back after checking with Lauren Guse.  She was agreeable to this plan.

## 2018-06-11 NOTE — Telephone Encounter (Signed)
Called Pt back, told her if her elbow is feeling better it would be okay, just to make sure to wrap it for compression

## 2018-06-11 NOTE — Telephone Encounter (Signed)
  Reason for Disposition . [1] Caller requesting NON-URGENT health information AND [2] PCP's office is the best resource  Protocols used: INFORMATION ONLY CALL-A-AH  

## 2018-06-11 NOTE — Telephone Encounter (Signed)
I want to play golf tomorrow.   Is it ok?  She was seen by Philis Nettle, FNP 06/10/2018 for cellulitis of left upper extremity (elbow).  I let her know someone would call her back after checking with Lauren Guse.  She was agreeable to this plan.

## 2018-06-11 NOTE — Telephone Encounter (Signed)
It really would be base on how patient is feeling  If her pain is getting better, redness improving then I say yes it would be OK. She can wear ACE wrap on elbow when playing for compression.  LG

## 2018-06-22 ENCOUNTER — Other Ambulatory Visit: Payer: Self-pay | Admitting: Family Medicine

## 2018-07-02 ENCOUNTER — Other Ambulatory Visit: Payer: Self-pay | Admitting: Family Medicine

## 2018-07-02 ENCOUNTER — Telehealth: Payer: Self-pay

## 2018-07-02 NOTE — Telephone Encounter (Signed)
Copied from [place] [phone]. Topic: General - Other >> Jul 02, 2018  8:27 AM Virl Axe D wrote: Reason for CRM: Pt has an appointment with Dr. Caryl Bis on 07/16/18 and would like to reschedule for a couple of months out. Please advise.

## 2018-07-03 ENCOUNTER — Other Ambulatory Visit: Payer: Self-pay

## 2018-07-03 ENCOUNTER — Encounter: Payer: Self-pay | Admitting: Family Medicine

## 2018-07-03 ENCOUNTER — Telehealth: Payer: Self-pay

## 2018-07-03 ENCOUNTER — Ambulatory Visit (INDEPENDENT_AMBULATORY_CARE_PROVIDER_SITE_OTHER): Payer: Medicare Other | Admitting: Family Medicine

## 2018-07-03 DIAGNOSIS — L03114 Cellulitis of left upper limb: Secondary | ICD-10-CM | POA: Diagnosis not present

## 2018-07-03 MED ORDER — SULFAMETHOXAZOLE-TRIMETHOPRIM 800-160 MG PO TABS: 1.0000 | ORAL_TABLET | Freq: Two times a day (BID) | ORAL | 0 refills | Status: DC

## 2018-07-03 NOTE — Telephone Encounter (Signed)
Copied from [place] [id number]. Topic: General - Other >> Jul 03, 2018  8:42 AM Celene Kras A wrote: Reason for CRM: Pt called this morning about her elbow that she has been seen for before. Pt states she finished her round of antibiotics and the swelling and fever went away from her elbow leaving a little red knot. However, pt states this morning upon waking up her elbow has begun to swell and get warm again. Pt is requesting advice on wether to make an appt or to receive another round of antibiotics. Please advise.

## 2018-07-03 NOTE — Telephone Encounter (Signed)
Last OV 01/13/2018  Last refilled 06/03/2018 disp 30 with no refills   Next OV 07/16/2018  Sent to PCP for approval

## 2018-07-03 NOTE — Telephone Encounter (Signed)
Sent to Jill  

## 2018-07-03 NOTE — Progress Notes (Signed)
Patient ID: Tammy Dalton, female   DOB: Sep 26, 1945, 73 y.o.   MRN: [id number]  Virtual Visit via video Note  This visit type was conducted due to national recommendations for restrictions regarding the COVID-19 pandemic (e.g. social distancing).  This format is felt to be most appropriate for this patient at this time.  All issues noted in this document were discussed and addressed.  No physical exam was performed (except for noted visual exam findings with Video Visits).   I connected with Tammy Dalton on 07/03/18 at  3:40 PM EDT by a video enabled telemedicine application and verified that I am speaking with the correct person using two identifiers. Location patient: home Location provider: [place] Persons participating in the virtual visit: patient, provider  I discussed the limitations, risks, security and privacy concerns of performing an evaluation and management service by telephone and the availability of in person appointments. I also discussed with the patient that there may be a patient responsible charge related to this service. The patient expressed understanding and agreed to proceed.   HPI:  Patient and I connected via video to discuss redness and swelling of left elbow.  Patient previously had this issue in mid March, she was treated with course of Keflex and the swelling and redness resolved.  Patient states 2 days ago, she noticed that her elbow was becoming a little red and a little sore again similarly to what occurred in March.  Denies bumping or hitting elbow, but states it is possible she did bump her elbow or scratch elbow.  Denies fever or chills.  Denies difficulty with range of motion of extremities.  Denies chest pain, shortness of breath or wheezing.  Denies cough.  Denies fever or chills.  Denies body aches.  Denies GI or GU issues.  ROS: See pertinent positives and negatives per HPI.  Past Medical History:  Diagnosis Date  . Hyperlipidemia   . Hypertension    . Thyroid disease     Past Surgical History:  Procedure Laterality Date  . anal sphincterotomy and fissurectomy  2009  . COLONOSCOPY  05/24/2011   Dr. Jill Side done at [facility]  . COLONOSCOPY  05/20/2000   Dr. Jamal Collin; Sharp bend at 50 cm. Scope could not be advanced any further even with glucagon.  . COLONOSCOPY WITH PROPOFOL N/A 04/24/2016   Procedure: COLONOSCOPY WITH PROPOFOL;  Surgeon: Christene Lye, MD;  Location: [facility] ENDOSCOPY;  Service: Endoscopy;  Laterality: N/A;  . EYE SURGERY Bilateral 2010   cataract surgery.   . TONSILLECTOMY      Family History  Problem Relation Age of Onset  . Depression Mother   . Heart disease Father   . Early death Sister   . Alcohol abuse Brother   . Stroke Paternal Grandfather     Social History   Tobacco Use  . Smoking status: Former Smoker    Types: Cigarettes  . Smokeless tobacco: Never Used  Substance Use Topics  . Alcohol use: No    Current Outpatient Medications:  .  aspirin EC 81 MG tablet, Take 81 mg by mouth daily., Disp: , Rfl:  .  Calcium Carbonate-Vitamin D (CALCIUM PLUS VITAMIN D PO), Take by mouth., Disp: , Rfl:  .  cephALEXin (KEFLEX) 500 MG capsule, Take 1 capsule (500 mg total) by mouth 4 (four) times daily., Disp: 40 capsule, Rfl: 0 .  cholecalciferol (VITAMIN D) 1000 units tablet, Take 1,000 Units by mouth daily., Disp: , Rfl:  .  ferrous sulfate 325 (65 FE) MG tablet, Take 325 mg by mouth daily with breakfast., Disp: , Rfl:  .  levothyroxine (SYNTHROID, LEVOTHROID) 100 MCG tablet, TAKE 1 TABLET BY MOUTH ONCE DAILY BEFORE BREAKFAST, Disp: 90 tablet, Rfl: 3 .  lisinopril-hydrochlorothiazide (PRINZIDE,ZESTORETIC) 20-25 MG tablet, Take 1 tablet by mouth once daily, Disp: 90 tablet, Rfl: 0 .  niacin 500 MG tablet, Take 500 mg by mouth at bedtime., Disp: , Rfl:  .  Omega-3 Fatty Acids (FISH OIL) 1200 MG CAPS, Take by mouth., Disp: , Rfl:  .  rosuvastatin (CRESTOR) 20 MG tablet,  Take 1 tablet (20 mg total) by mouth daily., Disp: 90 tablet, Rfl: 3 .  simvastatin (ZOCOR) 40 MG tablet, TAKE 1 TABLET BY MOUTH ONCE DAILY AT 6 PM., Disp: 90 tablet, Rfl: 0 .  sulfamethoxazole-trimethoprim (BACTRIM DS,SEPTRA DS) 800-160 MG tablet, Take 1 tablet by mouth 2 (two) times daily., Disp: 20 tablet, Rfl: 0 .  Turmeric Curcumin 500 MG CAPS, Take by mouth., Disp: , Rfl:  .  zolpidem (AMBIEN) 5 MG tablet, TAKE 1 TABLET BY MOUTH AT BEDTIME AS NEEDED FOR SLEEP, Disp: 30 tablet, Rfl: 0  EXAM:   GENERAL: alert, oriented, appears well and in no acute distress  HEENT: atraumatic, conjunttiva clear, no obvious abnormalities on inspection of external nose and ears  NECK: normal movements of the head and neck  LUNGS: on inspection no signs of respiratory distress, breathing rate appears normal, no obvious gross SOB, gasping or wheezing  CV: no obvious cyanosis  SKIN: Redness on left elbow about the size of a quarter. Mild swelling.   MS: moves all visible extremities without noticeable abnormality  PSYCH/NEURO: pleasant and cooperative, no obvious depression or anxiety, speech and thought processing grossly intact  ASSESSMENT AND PLAN:  Discussed the following assessment and plan:  Cellulitis of left upper extremity - Plan: sulfamethoxazole-trimethoprim (BACTRIM DS,SEPTRA DS) 800-160 MG tablet  Patient will take course of Bactrim to treat suspected cellulitis of left elbow.  Advised to rewrap elbow with Ace bandage as she was instructed at last visit to help reduce any swelling.  Advised she can use Tylenol as needed for pain.  Advised to call office right away if redness and swelling worsens rather than improves and if she develops any signs of infection like high fever, increased redness, increased pain, drainage from site.    I discussed the assessment and treatment plan with the patient. The patient was provided an opportunity to ask questions and all were answered. The patient  agreed with the plan and demonstrated an understanding of the instructions.   The patient was advised to call back or seek an in-person evaluation if the symptoms worsen or if the condition fails to improve as anticipated.   Jodelle Green, FNP

## 2018-07-03 NOTE — Telephone Encounter (Signed)
Yes we can do a virtual doxy visit for this, I should be able to see her elbow over the video

## 2018-07-03 NOTE — Telephone Encounter (Signed)
Pt called [facility] for CRM: Pt called this morning about her elbow that she has been seen for before. Pt states she finished her round of antibiotics and the swelling and fever went away from her elbow leaving a little red knot. However, pt states this morning upon waking up her elbow has begun to swell and get warm again. Pt is requesting advice on wether to make an appt or to receive another round of antibiotics. Please advise.  Pt has [facility] appt today at 3:40pm with Ander Purpura GuseFNP

## 2018-07-04 ENCOUNTER — Ambulatory Visit: Payer: Self-pay | Admitting: Family Medicine

## 2018-07-04 MED ORDER — DOXYCYCLINE HYCLATE 100 MG PO TABS: 100.0000 mg | ORAL_TABLET | Freq: Two times a day (BID) | ORAL | 0 refills | Status: DC

## 2018-07-04 NOTE — Addendum Note (Signed)
Addended by: Perlie Mayo on: 07/04/2018 09:46 AM   Modules accepted: Orders

## 2018-07-04 NOTE — Telephone Encounter (Signed)
Reviewed recent virtual visit note regarding cellulitis of the elbow.  She is having side effects from Bactrim, will change to doxycycline 100mg  BID x10 days.  She will f/u with PCP if not improving.

## 2018-07-04 NOTE — Telephone Encounter (Signed)
Pt. Is requesting a different antibiotic be sent to her pharmacy. Took one dose of Bactrim yesterday evening for cellulitis to left elbow (re-occurring). Immediately "felt bad all over and had diarrhea."  Spoke with Dr. Zigmund Daniel and he will send in a different antibiotic.  Answer Assessment - Initial Assessment Questions 1. SYMPTOMS: "Do you have any symptoms?"     Reports as soon as she took the first dose of Bactrim she "just didn't feel right. I felt bad all over and had immediate diarrhea.I don't want to take anymore or it." States "I did fine with the Keflex." Suggested she could try a probiotic for the diarrhea - refuses. 2. SEVERITY: If symptoms are present, ask "Are they mild, moderate or severe?"     Mild- Moderate  Protocols used: MEDICATION QUESTION CALL-A-AH

## 2018-07-16 ENCOUNTER — Ambulatory Visit: Payer: Medicare Other | Admitting: Family Medicine

## 2018-07-20 ENCOUNTER — Other Ambulatory Visit: Payer: Self-pay | Admitting: Family Medicine

## 2018-08-01 ENCOUNTER — Other Ambulatory Visit: Payer: Self-pay | Admitting: Family Medicine

## 2018-08-01 NOTE — Telephone Encounter (Signed)
Last OV 07/03/2018  Last refilled   Disp Refills Start End   zolpidem (AMBIEN) 5 MG tablet 30 tablet 0 07/03/2018       Next OV 10/13/2018  Sent to PCP to advise

## 2018-09-01 ENCOUNTER — Other Ambulatory Visit: Payer: Self-pay | Admitting: Family Medicine

## 2018-09-01 DIAGNOSIS — Z1231 Encounter for screening mammogram for malignant neoplasm of breast: Secondary | ICD-10-CM | POA: Diagnosis not present

## 2018-09-01 LAB — HM MAMMOGRAPHY: HM Mammogram: NORMAL (ref 0–4)

## 2018-09-03 ENCOUNTER — Telehealth: Payer: Self-pay

## 2018-09-03 NOTE — Telephone Encounter (Signed)
Copied from [place] [id number]. Topic: General - Other >> Sep 03, 2018  9:27 AM Leward Quan A wrote: Reason for CRM: Patient called to check on the status of her zolpidem (AMBIEN) 5 MG tablet say that she is all out. Request was sent from the pharmacy on 09/01/2018

## 2018-09-03 NOTE — Telephone Encounter (Signed)
Pharmacy refill request sent to PCP.

## 2018-09-03 NOTE — Telephone Encounter (Signed)
Patient says she is out of Ambien.  Last OV-01/14/19  Last Labs- 02/11/18  Last Refill- 08/01/18  Next Appt- 10/13/18

## 2018-10-01 ENCOUNTER — Other Ambulatory Visit: Payer: Self-pay | Admitting: Family Medicine

## 2018-10-01 NOTE — Telephone Encounter (Signed)
Refilled: 09/04/2018 Last OV: 01/13/2018 Next OV: 10/13/2018

## 2018-10-13 ENCOUNTER — Ambulatory Visit: Payer: Self-pay | Admitting: *Deleted

## 2018-10-13 ENCOUNTER — Ambulatory Visit: Payer: Medicare Other | Admitting: Family Medicine

## 2018-10-13 NOTE — Telephone Encounter (Signed)
Patient called with learning she had 100.0 temperature (thermascan) while attempting to donate blood today. Denies all other symptoms. She feels fine. Encouraged [phone] testing tomorrow. Discussed starting quarantine until she is without fever for 3 consecutive days-no advil/tylenol to reduce, and until negative test results comes in. Informed her to wear a mask and stay in vehicle at the testing site tomorrow. She will monitor temperature and breathing while quarantining and waiting test results, all out of precaution. Stated she understood all discussed.   Reason for Disposition . [1] Fever AND [2] no signs of serious infection or localizing symptoms (all other triage questions negative)  Answer Assessment - Initial Assessment Questions 1. TEMPERATURE: "What is the most recent temperature?"  "How was it measured?"      100.0 thermascan today. 2. ONSET: "When did the fever start?"     today 3. SYMPTOMS: "Do you have any other symptoms besides the fever?"  (e.g., colds, headache, sore throat, earache, cough, rash, diarrhea, vomiting, abdominal pain)     Denies all symptoms, only fever. 4. CAUSE: If there are no symptoms, ask: "What do you think is causing the fever?"      No idea 5. CONTACTS: "Does anyone else in the family have an infection?"    no 6. TREATMENT: "What have you done so far to treat this fever?" (e.g., medications)     Took advil after learning her temperature was up. 7. IMMUNOCOMPROMISE: "Do you have of the following: diabetes, HIV positive, splenectomy, cancer chemotherapy, chronic steroid treatment, transplant patient, etc."     no 8. PREGNANCY: "Is there any chance you are pregnant?" "When was your last menstrual period?"    na 9. TRAVEL: "Have you traveled out of the country in the last month?" (e.g., travel history, exposures)    No to all.  Protocols used: FEVER-A-AH

## 2018-10-14 ENCOUNTER — Other Ambulatory Visit: Payer: Self-pay

## 2018-10-14 DIAGNOSIS — Z20822 Contact with and (suspected) exposure to covid-19: Secondary | ICD-10-CM

## 2018-10-14 DIAGNOSIS — R6889 Other general symptoms and signs: Secondary | ICD-10-CM | POA: Diagnosis not present

## 2018-10-16 LAB — NOVEL CORONAVIRUS, NAA: SARS-CoV-2, NAA: NOT DETECTED

## 2018-10-30 ENCOUNTER — Other Ambulatory Visit: Payer: Self-pay | Admitting: Family Medicine

## 2018-11-04 ENCOUNTER — Other Ambulatory Visit: Payer: Self-pay

## 2018-11-04 ENCOUNTER — Telehealth: Payer: Self-pay | Admitting: Family Medicine

## 2018-11-04 ENCOUNTER — Telehealth: Payer: Self-pay

## 2018-11-04 DIAGNOSIS — E785 Hyperlipidemia, unspecified: Secondary | ICD-10-CM

## 2018-11-04 MED ORDER — ZOLPIDEM TARTRATE 5 MG PO TABS: 5.0000 mg | ORAL_TABLET | Freq: Every evening | ORAL | 0 refills | Status: DC | PRN

## 2018-11-04 MED ORDER — SIMVASTATIN 40 MG PO TABS: ORAL_TABLET | ORAL | 0 refills | Status: DC

## 2018-11-04 MED ORDER — ROSUVASTATIN CALCIUM 20 MG PO TABS: 20.0000 mg | ORAL_TABLET | Freq: Every day | ORAL | 3 refills | Status: DC

## 2018-11-04 MED ORDER — LEVOTHYROXINE SODIUM 100 MCG PO TABS: ORAL_TABLET | ORAL | 0 refills | Status: DC

## 2018-11-04 MED ORDER — LISINOPRIL-HYDROCHLOROTHIAZIDE 20-25 MG PO TABS: 1.0000 | ORAL_TABLET | Freq: Every day | ORAL | 0 refills | Status: DC

## 2018-11-04 NOTE — Telephone Encounter (Signed)
Pt called and says she is out of her zolpidem (AMBIEN) 5 MG tablet and other medications and needs a refill. Pt is not at home to let us know what the other medications are that need refilled.

## 2018-11-04 NOTE — Progress Notes (Signed)
Refills were sent to patient's pharmacy per patient request.  Tammy Dalton,cma

## 2018-11-04 NOTE — Telephone Encounter (Signed)
I will refill the ambien. The patient should not be on both crestor and simvastatin. Please contact her and see which one she is currently taking. Please then contact the pharmacy and cancel the prescription for the one that she is not taking. Thanks.

## 2018-11-06 ENCOUNTER — Telehealth: Payer: Self-pay | Admitting: Family Medicine

## 2018-11-06 NOTE — Telephone Encounter (Signed)
Relation to pt: self  Call back number: [phone]    Reason for call:  Patient states  rosuvastatin (CRESTOR) 20 MG tablet was prescribed but has never taken this medication, please advise

## 2018-11-06 NOTE — Telephone Encounter (Signed)
Patient said that she picked up her medicine and had a Rx for rosuvastatin (Crestor).  Pt said that she has never taken rosuvastatin before.  Pt said she has only been taking simvastatin (Zocor).   Pt needs to be advised on whether or not she should be taking this medication.  Please call back pt at [phone].

## 2018-11-07 NOTE — Telephone Encounter (Signed)
Called the patient and she had already picked up both RX's and she stated she never opened it and will try to get her money back, I also called the pharamcy and had them to take the medication off her file and they did.  Nina,cma

## 2018-12-02 ENCOUNTER — Other Ambulatory Visit: Payer: Self-pay | Admitting: Family Medicine

## 2018-12-15 ENCOUNTER — Telehealth: Payer: Self-pay

## 2018-12-15 NOTE — Telephone Encounter (Signed)
Pt stated she has an appt with Dr. Caryl Bis tomorrow and she would like to discuss having a 90 day Rx for zolpidem (AMBIEN) 5 MG tablet.  Toshika Parrow,cma

## 2018-12-15 NOTE — Telephone Encounter (Signed)
We can discuss at her visit tomorrow.

## 2018-12-15 NOTE — Telephone Encounter (Signed)
Copied from [place] [id number]. Topic: General - Other >> Dec 15, 2018 12:40 PM Mcneil, Ja-Kwan wrote: Reason for CRM: Pt stated she has an appt with Dr. Caryl Bis tomorrow and she would like to discuss having a 90 day Rx for zolpidem (AMBIEN) 5 MG tablet.

## 2018-12-16 ENCOUNTER — Encounter: Payer: Self-pay | Admitting: Family Medicine

## 2018-12-16 ENCOUNTER — Ambulatory Visit (INDEPENDENT_AMBULATORY_CARE_PROVIDER_SITE_OTHER): Payer: Medicare Other | Admitting: Family Medicine

## 2018-12-16 ENCOUNTER — Other Ambulatory Visit: Payer: Self-pay

## 2018-12-16 DIAGNOSIS — I10 Essential (primary) hypertension: Secondary | ICD-10-CM | POA: Diagnosis not present

## 2018-12-16 DIAGNOSIS — E785 Hyperlipidemia, unspecified: Secondary | ICD-10-CM | POA: Diagnosis not present

## 2018-12-16 DIAGNOSIS — G479 Sleep disorder, unspecified: Secondary | ICD-10-CM

## 2018-12-16 DIAGNOSIS — E039 Hypothyroidism, unspecified: Secondary | ICD-10-CM | POA: Diagnosis not present

## 2018-12-16 MED ORDER — ZOLPIDEM TARTRATE 5 MG PO TABS: 5.0000 mg | ORAL_TABLET | Freq: Every evening | ORAL | 0 refills | Status: DC | PRN

## 2018-12-16 NOTE — Assessment & Plan Note (Signed)
Reportedly well controlled at [facility].  She will continue her current regimen.  She will come in for lab work.

## 2018-12-16 NOTE — Progress Notes (Signed)
Virtual Visit via video Note  This visit type was conducted due to national recommendations for restrictions regarding the COVID-19 pandemic (e.g. social distancing).  This format is felt to be most appropriate for this patient at this time.  All issues noted in this document were discussed and addressed.  No physical exam was performed (except for noted visual exam findings with Video Visits).   I connected with Tammy Dalton today at  3:30 PM EDT by a video enabled telemedicine application and verified that I am speaking with the correct person using two identifiers. Location patient: car Location provider: work Persons participating in the virtual visit: patient, provider  I discussed the limitations, risks, security and privacy concerns of performing an evaluation and management service by telephone and the availability of in person appointments. I also discussed with the patient that there may be a patient responsible charge related to this service. The patient expressed understanding and agreed to proceed.   Reason for visit: Follow-up.  HPI: Hypertension: Get this checked at the [facility] when she donates blood.  Typically 120s over 70s.  No chest pain, shortness of breath, or edema.  Hypothyroidism: Taking Synthroid.  No skin changes, heat or cold intolerance.  Insomnia: Taking Ambien.  This is helpful.  It does take up to an hour and a half to fall asleep though she gets 5 hours of sleep with this.  No alcohol intake.  Hyperlipidemia: Taking simvastatin.  No right upper quadrant pain or myalgias.   ROS: See pertinent positives and negatives per HPI.  Past Medical History:  Diagnosis Date  . Hyperlipidemia   . Hypertension   . Thyroid disease     Past Surgical History:  Procedure Laterality Date  . anal sphincterotomy and fissurectomy  2009  . COLONOSCOPY  05/24/2011   Dr. Jill Side done at [facility]  . COLONOSCOPY  05/20/2000   Dr.  Jamal Collin; Sharp bend at 50 cm. Scope could not be advanced any further even with glucagon.  . COLONOSCOPY WITH PROPOFOL N/A 04/24/2016   Procedure: COLONOSCOPY WITH PROPOFOL;  Surgeon: Christene Lye, MD;  Location: [facility] ENDOSCOPY;  Service: Endoscopy;  Laterality: N/A;  . EYE SURGERY Bilateral 2010   cataract surgery.   . TONSILLECTOMY      Family History  Problem Relation Age of Onset  . Depression Mother   . Heart disease Father   . Early death Sister   . Alcohol abuse Brother   . Stroke Paternal Grandfather     SOCIAL HX: Former smoker   Current Outpatient Medications:  .  aspirin EC 81 MG tablet, Take 81 mg by mouth daily., Disp: , Rfl:  .  Calcium Carbonate-Vitamin D (CALCIUM PLUS VITAMIN D PO), Take by mouth., Disp: , Rfl:  .  chlorhexidine (PERIDEX) 0.12 % solution, RINSE MOUTH WITH 15 MLS (1 CAPFUL) FOR 30 SECONDS AM AND PM AFTER BRUSHING TEETH. EXPECTORATE AFTER RINSING. DO NOT SWALLOW, Disp: , Rfl:  .  cholecalciferol (VITAMIN D) 1000 units tablet, Take 1,000 Units by mouth daily., Disp: , Rfl:  .  ferrous sulfate 325 (65 FE) MG tablet, Take 325 mg by mouth daily with breakfast., Disp: , Rfl:  .  levothyroxine (SYNTHROID) 100 MCG tablet, TAKE 1 TABLET BY MOUTH ONCE DAILY BEFORE BREAKFAST, Disp: 90 tablet, Rfl: 0 .  lisinopril-hydrochlorothiazide (ZESTORETIC) 20-25 MG tablet, Take 1 tablet by mouth daily., Disp: 90 tablet, Rfl: 0 .  niacin 500 MG tablet, Take 500 mg by mouth  at bedtime., Disp: , Rfl:  .  Omega-3 Fatty Acids (FISH OIL) 1200 MG CAPS, Take by mouth., Disp: , Rfl:  .  simvastatin (ZOCOR) 40 MG tablet, TAKE 1 TABLET BY MOUTH ONCE DAILY AT  6PM, Disp: 90 tablet, Rfl: 0 .  Turmeric Curcumin 500 MG CAPS, Take by mouth., Disp: , Rfl:  .  [START ON 01/03/2019] zolpidem (AMBIEN) 5 MG tablet, Take 1 tablet (5 mg total) by mouth at bedtime as needed. for sleep, Disp: 90 tablet, Rfl: 0  EXAM:  VITALS per patient if applicable: None.  GENERAL: alert, oriented,  appears well and in no acute distress  HEENT: atraumatic, conjunttiva clear, no obvious abnormalities on inspection of external nose and ears  NECK: normal movements of the head and neck  LUNGS: on inspection no signs of respiratory distress, breathing rate appears normal, no obvious gross SOB, gasping or wheezing  CV: no obvious cyanosis  MS: moves all visible extremities without noticeable abnormality  PSYCH/NEURO: pleasant and cooperative, no obvious depression or anxiety, speech and thought processing grossly intact  ASSESSMENT AND PLAN:  Discussed the following assessment and plan:  Hypertension Reportedly well controlled at [facility].  She will continue her current regimen.  She will come in for lab work.  Hypothyroidism Continue Synthroid.  Check TSH.  Hyperlipidemia Continue simvastatin.  Check lipid panel.  Sleeping difficulty Stable.  Continue Ambien.  Refill prescribed.  Controlled substance database reviewed.    I discussed the assessment and treatment plan with the patient. The patient was provided an opportunity to ask questions and all were answered. The patient agreed with the plan and demonstrated an understanding of the instructions.   The patient was advised to call back or seek an in-person evaluation if the symptoms worsen or if the condition fails to improve as anticipated.   Tammy Rumps, MD

## 2018-12-16 NOTE — Assessment & Plan Note (Signed)
Stable.  Continue Ambien.  Refill prescribed.  Controlled substance database reviewed.

## 2018-12-16 NOTE — Assessment & Plan Note (Signed)
Continue Synthroid.  Check TSH. 

## 2018-12-16 NOTE — Assessment & Plan Note (Signed)
Continue simvastatin.  Check lipid panel. 

## 2018-12-18 ENCOUNTER — Telehealth: Payer: Self-pay | Admitting: Family Medicine

## 2018-12-18 NOTE — Telephone Encounter (Signed)
I spoke with pt she will call back to schedule 6 month follow up.

## 2018-12-23 ENCOUNTER — Other Ambulatory Visit (INDEPENDENT_AMBULATORY_CARE_PROVIDER_SITE_OTHER): Payer: Medicare Other

## 2018-12-23 ENCOUNTER — Other Ambulatory Visit: Payer: Self-pay

## 2018-12-23 DIAGNOSIS — E785 Hyperlipidemia, unspecified: Secondary | ICD-10-CM | POA: Diagnosis not present

## 2018-12-23 DIAGNOSIS — E039 Hypothyroidism, unspecified: Secondary | ICD-10-CM

## 2018-12-23 LAB — LIPID PANEL
Cholesterol: 173 mg/dL (ref 0–200)
HDL: 54.5 mg/dL (ref >39.00)
LDL Cholesterol: 93 mg/dL (ref 0–99)
NonHDL: 118.04
Total CHOL/HDL Ratio: 3
Triglycerides: 123 mg/dL (ref 0.0–149.0)
VLDL: 24.6 mg/dL (ref 0.0–40.0)

## 2018-12-23 LAB — COMPREHENSIVE METABOLIC PANEL
ALT: 17 U/L (ref 0–35)
AST: 21 U/L (ref 0–37)
Albumin: 4.5 g/dL (ref 3.5–5.2)
Alkaline Phosphatase: 64 U/L (ref 39–117)
BUN: 14 mg/dL (ref 6–23)
CO2: 28 mEq/L (ref 19–32)
Calcium: 9.8 mg/dL (ref 8.4–10.5)
Chloride: 102 mEq/L (ref 96–112)
Creatinine, Ser: 1.16 mg/dL (ref 0.40–1.20)
GFR: 45.91 mL/min — ABNORMAL LOW (ref >60.00)
Glucose, Bld: 98 mg/dL (ref 70–99)
Potassium: 3.6 mEq/L (ref 3.5–5.1)
Sodium: 141 mEq/L (ref 135–145)
Total Bilirubin: 0.7 mg/dL (ref 0.2–1.2)
Total Protein: 6.8 g/dL (ref 6.0–8.3)

## 2018-12-23 LAB — TSH: TSH: 1.12 u[IU]/mL (ref 0.35–4.50)

## 2018-12-31 ENCOUNTER — Telehealth: Payer: Self-pay

## 2018-12-31 NOTE — Telephone Encounter (Signed)
Copied from [place] [id number]. Topic: General - Other >> Dec 31, 2018  9:51 AM Leward Quan A wrote: Reason for CRM: Patient called to ask Dr Caryl Bis if there is anything that she could be doing to help work on her kidney until she is tested again in January. Maybe change in her diet or more fluids something anything. Please advise Ph#  [phone]

## 2019-01-01 NOTE — Telephone Encounter (Signed)
There is not anything specific for her to do at this time other than avoiding NSAIDs and maintain good hydration.

## 2019-01-19 ENCOUNTER — Ambulatory Visit: Payer: Medicare Other | Admitting: Family Medicine

## 2019-01-19 ENCOUNTER — Ambulatory Visit: Payer: Medicare Other

## 2019-02-01 ENCOUNTER — Other Ambulatory Visit: Payer: Self-pay | Admitting: Family Medicine

## 2019-02-23 ENCOUNTER — Telehealth: Payer: Self-pay | Admitting: Family Medicine

## 2019-02-23 ENCOUNTER — Ambulatory Visit (INDEPENDENT_AMBULATORY_CARE_PROVIDER_SITE_OTHER): Payer: Medicare Other | Admitting: Family Medicine

## 2019-02-23 ENCOUNTER — Encounter: Payer: Self-pay | Admitting: Family Medicine

## 2019-02-23 DIAGNOSIS — R944 Abnormal results of kidney function studies: Secondary | ICD-10-CM

## 2019-02-23 DIAGNOSIS — J029 Acute pharyngitis, unspecified: Secondary | ICD-10-CM | POA: Diagnosis not present

## 2019-02-23 NOTE — Telephone Encounter (Signed)
Noted. Patient should keep virtual visit.

## 2019-02-23 NOTE — Progress Notes (Signed)
Virtual Visit via Video Note  I connected with Tammy Dalton on 02/23/19 at  4:00 PM EST by a video enabled telemedicine application and verified that I am speaking with the correct person using two identifiers.  Location: Patient: [place] her home Provider: [place]   I discussed the limitations of evaluation and management by telemedicine and the availability of in person appointments. The patient expressed understanding and agreed to proceed.  History of Present Illness: This is a 73 year old female who presents today for virtual video visit for chief complaint of sore throat x3 days.  The sore throat seems to come and go throughout the day, worse in the morning and in the afternoons.  She was unable to check her vital signs for today's visit.  She has not felt feverish.  She does not have a thermometer.  She has a rare dry cough.  She does have some postnasal drainage but no nasal drainage.  She denies ear pain, headache, white spots on her throat, wheeze, shortness of breath.  She has no known sick contacts.  She reports that it is common for her to get a sore throat around this time of year.  She has not had any trouble swallowing food or liquid.  She is not aware if she has seasonal allergies.  She has taken 2 tablets of either ibuprofen or acetaminophen a couple of days ago with some relief.  She has also been using [place] sore throat lozenges and gargling with warm salt/vinegar water.   Past Medical History:  Diagnosis Date  . Hyperlipidemia   . Hypertension   . Thyroid disease    Past Surgical History:  Procedure Laterality Date  . anal sphincterotomy and fissurectomy  2009  . COLONOSCOPY  05/24/2011   Dr. Jill Side done at [facility]  . COLONOSCOPY  05/20/2000   Dr. Jamal Collin; Sharp bend at 50 cm. Scope could not be advanced any further even with glucagon.  . COLONOSCOPY WITH PROPOFOL N/A 04/24/2016   Procedure: COLONOSCOPY WITH PROPOFOL;   Surgeon: Christene Lye, MD;  Location: [facility] ENDOSCOPY;  Service: Endoscopy;  Laterality: N/A;  . EYE SURGERY Bilateral 2010   cataract surgery.   . TONSILLECTOMY     Family History  Problem Relation Age of Onset  . Depression Mother   . Heart disease Father   . Early death Sister   . Alcohol abuse Brother   . Stroke Paternal Grandfather    Social History   Tobacco Use  . Smoking status: Former Smoker    Types: Cigarettes  . Smokeless tobacco: Never Used  Substance Use Topics  . Alcohol use: No  . Drug use: No    Observations/Objective: Patient is alert and answers questions appropriately.  Visible skin is unremarkable.  Respirations are even and unlabored without increased work of breathing.  There is no audible wheeze or witnessed cough.  Mood and affect are appropriate.  Assessment and Plan: 1. Sore throat -Seems likely related to postnasal drainage as she does not have symptoms of viral illness -Discussed symptomatic treatment, acetaminophen, warm salt water gargles.  Follow-up precautions reviewed.  2. Decreased GFR -Noted on prior labs and she reports that she has left follow-up upcoming.  Advised her to avoid NSAIDs.   Clarene Reamer, FNP-BC  [facility]  02/23/2019 5:58 PM   Follow Up Instructions:    I discussed the assessment and treatment plan with the patient. The  patient was provided an opportunity to ask questions and all were answered. The patient agreed with the plan and demonstrated an understanding of the instructions.   The patient was advised to call back or seek an in-person evaluation if the symptoms worsen or if the condition fails to improve as anticipated.   Elby Beck, FNP

## 2019-02-23 NOTE — Telephone Encounter (Signed)
Patient say sore throat started on Saturday . No other symptoms scheduled with [place] at [facility] virtually. FYI

## 2019-02-23 NOTE — Telephone Encounter (Signed)
Pt stated she developed a sore throat on 02/21/19 with no fever. She would like to know if something can be sent to her pharmacy. Please advise.  [facility] [address] [place] [phone] (Phone) [phone] (Fax)

## 2019-03-29 ENCOUNTER — Other Ambulatory Visit: Payer: Self-pay | Admitting: Family Medicine

## 2019-03-30 ENCOUNTER — Telehealth: Payer: Self-pay | Admitting: Family Medicine

## 2019-03-30 NOTE — Telephone Encounter (Signed)
Pt needs an order for lab work per your last visit with her. She states she has been worried about ehr kidney function. She has an appt scheduled with you on 04/01/2019 but she states she needs to come in for labs and no appt with you?  Please advise.

## 2019-03-31 ENCOUNTER — Other Ambulatory Visit: Payer: Self-pay

## 2019-03-31 DIAGNOSIS — D2272 Melanocytic nevi of left lower limb, including hip: Secondary | ICD-10-CM | POA: Diagnosis not present

## 2019-03-31 DIAGNOSIS — D225 Melanocytic nevi of trunk: Secondary | ICD-10-CM | POA: Diagnosis not present

## 2019-03-31 DIAGNOSIS — D485 Neoplasm of uncertain behavior of skin: Secondary | ICD-10-CM | POA: Diagnosis not present

## 2019-03-31 DIAGNOSIS — L570 Actinic keratosis: Secondary | ICD-10-CM | POA: Diagnosis not present

## 2019-03-31 DIAGNOSIS — L820 Inflamed seborrheic keratosis: Secondary | ICD-10-CM | POA: Diagnosis not present

## 2019-03-31 DIAGNOSIS — D2262 Melanocytic nevi of left upper limb, including shoulder: Secondary | ICD-10-CM | POA: Diagnosis not present

## 2019-03-31 DIAGNOSIS — L859 Epidermal thickening, unspecified: Secondary | ICD-10-CM | POA: Diagnosis not present

## 2019-03-31 DIAGNOSIS — Z85828 Personal history of other malignant neoplasm of skin: Secondary | ICD-10-CM | POA: Diagnosis not present

## 2019-03-31 NOTE — Telephone Encounter (Signed)
Patient has an appointment with me in the office tomorrow it is fine for her to come into the office if she can pass the screening.  Thanks.

## 2019-03-31 NOTE — Telephone Encounter (Signed)
Please ask Dr. Caryl Bis if this patient can come into the office. If so we will call her and screen her. Right now there are no orders for labs.

## 2019-03-31 NOTE — Telephone Encounter (Signed)
Please ask Dr. Caryl Bis if this patient can come into the office. If so we will call her and screen her. Right now there are no orders for labs. Janetta Vandoren,cma

## 2019-03-31 NOTE — Telephone Encounter (Signed)
I called pt and left vm regarding msg.

## 2019-03-31 NOTE — Telephone Encounter (Signed)
This patient is allowed to come into the office tomorrow for her appointment per Dr. Caryl Bis only if she passes the screening, can you call and screen her and let her know.  Tammy Dalton,cma

## 2019-04-01 ENCOUNTER — Ambulatory Visit (INDEPENDENT_AMBULATORY_CARE_PROVIDER_SITE_OTHER): Payer: Medicare Other | Admitting: Family Medicine

## 2019-04-01 ENCOUNTER — Other Ambulatory Visit: Payer: Self-pay

## 2019-04-01 ENCOUNTER — Encounter: Payer: Self-pay | Admitting: Family Medicine

## 2019-04-01 VITALS — BP 130/70 | HR 80 | Temp 96.8°F | Ht 62.0 in | Wt 137.2 lb

## 2019-04-01 DIAGNOSIS — I10 Essential (primary) hypertension: Secondary | ICD-10-CM | POA: Diagnosis not present

## 2019-04-01 DIAGNOSIS — N1831 Chronic kidney disease, stage 3a: Secondary | ICD-10-CM | POA: Diagnosis not present

## 2019-04-01 DIAGNOSIS — G479 Sleep disorder, unspecified: Secondary | ICD-10-CM | POA: Diagnosis not present

## 2019-04-01 NOTE — Assessment & Plan Note (Signed)
Well-controlled.  Continue current regimen. 

## 2019-04-01 NOTE — Assessment & Plan Note (Signed)
Stable on Ambien.  She will continue this medication.

## 2019-04-01 NOTE — Assessment & Plan Note (Signed)
We will check her kidney function and urine protein creatinine ratio.  Discussed the potential for renal ultrasound and nephrology referral depending on her results.

## 2019-04-01 NOTE — Patient Instructions (Signed)
Nice to see you. We will get lab work today and contact you with the results. Please avoid anti-inflammatory such as ibuprofen or Aleve.  You can take Tylenol if needed for any discomfort.

## 2019-04-01 NOTE — Progress Notes (Signed)
  [name], MD Phone: [phone]  Tammy Dalton is a 74 y.o. female who presents today for follow-up.  CKD stage III: Kidney functions been relatively stable.  There was a mild dip recently.  She is here for recheck.  She rarely takes any NSAIDs.  No foamy urine.  She does note some urinary frequency that has been going on for some time.  Hypertension: Notes it is typically similar to today or better.  She takes lisinopril and HCTZ.  No chest pain, shortness of breath, or edema.  Insomnia: Taking Ambien.  No drowsiness.  She gets 6 hours of sleep with this.  Social History   Tobacco Use  Smoking Status Former Smoker  . Types: Cigarettes  Smokeless Tobacco Never Used     ROS see history of present illness  Objective  Physical Exam Vitals:   04/01/19 1548  BP: 130/70  Pulse: 80  Temp: (!) 96.8 F (36 C)  SpO2: 95%    BP Readings from Last 3 Encounters:  04/01/19 130/70  06/10/18 118/64  01/13/18 130/70   Wt Readings from Last 3 Encounters:  04/01/19 137 lb 3.2 oz (62.2 kg)  12/16/18 139 lb (63 kg)  06/10/18 134 lb (60.8 kg)    Physical Exam Constitutional:      General: She is not in acute distress.    Appearance: She is not diaphoretic.  Cardiovascular:     Rate and Rhythm: Normal rate and regular rhythm.     Heart sounds: Normal heart sounds.  Pulmonary:     Effort: Pulmonary effort is normal.     Breath sounds: Normal breath sounds.  Musculoskeletal:     Right lower leg: No edema.     Left lower leg: No edema.  Skin:    General: Skin is warm and dry.  Neurological:     Mental Status: She is alert.      Assessment/Plan: Please see individual problem list.  Hypertension Well-controlled.  Continue current regimen.  Stage 3a chronic kidney disease We will check her kidney function and urine protein creatinine ratio.  Discussed the potential for renal ultrasound and nephrology referral depending on her results.  Sleeping  difficulty Stable on Ambien.  She will continue this medication.   Orders Placed This Encounter  Procedures  . Basic Metabolic Panel (BMET)  . Protein / creatinine ratio, urine    No orders of the defined types were placed in this encounter.   This visit occurred during the SARS-CoV-2 public health emergency.  Safety protocols were in place, including screening questions prior to the visit, additional usage of staff PPE, and extensive cleaning of exam room while observing appropriate contact time as indicated for disinfecting solutions.    [name], MD [place]

## 2019-04-02 ENCOUNTER — Ambulatory Visit (INDEPENDENT_AMBULATORY_CARE_PROVIDER_SITE_OTHER): Payer: Medicare Other

## 2019-04-02 VITALS — Ht 62.0 in | Wt 137.0 lb

## 2019-04-02 DIAGNOSIS — Z0000 Encounter for general adult medical examination without abnormal findings: Secondary | ICD-10-CM

## 2019-04-02 LAB — BASIC METABOLIC PANEL
BUN: 18 mg/dL (ref 6–23)
CO2: 29 mEq/L (ref 19–32)
Calcium: 9.6 mg/dL (ref 8.4–10.5)
Chloride: 100 mEq/L (ref 96–112)
Creatinine, Ser: 1 mg/dL (ref 0.40–1.20)
GFR: 54.44 mL/min — ABNORMAL LOW (ref >60.00)
Glucose, Bld: 82 mg/dL (ref 70–99)
Potassium: 3.7 mEq/L (ref 3.5–5.1)
Sodium: 138 mEq/L (ref 135–145)

## 2019-04-02 LAB — PROTEIN / CREATININE RATIO, URINE
Creatinine, Urine: 83 mg/dL (ref 20–275)
Protein/Creat Ratio: 60 mg/g creat (ref 21–161)
Protein/Creatinine Ratio: 0.06 mg/mg creat (ref 0.021–0.16)
Total Protein, Urine: 5 mg/dL (ref 5–24)

## 2019-04-02 NOTE — Patient Instructions (Addendum)
  Tammy Dalton , Thank you for taking time to come for your Medicare Wellness Visit. I appreciate your ongoing commitment to your health goals. Please review the following plan we discussed and let me know if I can assist you in the future.   These are the goals we discussed: Goals      Patient Stated   . Follow up with Primary Care Provider (pt-stated)     As needed       This is a list of the screening recommended for you and due dates:  Health Maintenance  Topic Date Due  . Mammogram  08/31/2020  . Colon Cancer Screening  04/24/2026  . Tetanus Vaccine  04/14/2027  . Flu Shot  Completed  . DEXA scan (bone density measurement)  Completed  .  Hepatitis C: One time screening is recommended by Center for Disease Control  (CDC) for  adults born from [birth year] through 1964.   Completed  . Pneumonia vaccines  Completed

## 2019-04-02 NOTE — Progress Notes (Signed)
Subjective:   Tammy Dalton is a 74 y.o. female who presents for Medicare Annual (Subsequent) preventive examination.  Review of Systems:  No ROS.  Medicare Wellness Virtual Visit.  Visual/audio telehealth visit, UTA vital signs. Wt/Ht provided. See social history for additional risk factors.  Cardiac Risk Factors include: advanced age (>41men, >43 women);hypertension     Objective:     Vitals: Ht 5\' 2"  (1.575 m)   Wt 137 lb (62.1 kg)   BMI 25.06 kg/m   Body mass index is 25.06 kg/m.  Advanced Directives 04/02/2019 01/13/2018 04/13/2017 12/24/2016 04/24/2016  Does Patient Have a Medical Advance Directive? Yes Yes No Yes Yes  Type of Paramedic of [place];Living will [place];Living will - [place];Living will [place];Living will  Does patient want to make changes to medical advance directive? No - Patient declined No - Patient declined - No - Patient declined -  Copy of [place] in Chart? No - copy requested No - copy requested - No - copy requested No - copy requested  Would patient like information on creating a medical advance directive? - - No - Patient declined - -    Tobacco Social History   Tobacco Use  Smoking Status Former Smoker  . Types: Cigarettes  Smokeless Tobacco Never Used     Counseling given: Not Answered   Clinical Intake:  Pre-visit preparation completed: Yes        Diabetes: No  How often do you need to have someone help you when you read instructions, pamphlets, or other written materials from your doctor or pharmacy?: 1 - Never  Interpreter Needed?: No     Past Medical History:  Diagnosis Date  . Hyperlipidemia   . Hypertension   . Thyroid disease    Past Surgical History:  Procedure Laterality Date  . anal sphincterotomy and fissurectomy  2009  . COLONOSCOPY  05/24/2011   Dr. Jill Side done at [facility]  . COLONOSCOPY  05/20/2000   Dr. Jamal Collin; Sharp bend at 50 cm. Scope could not be advanced any further even with glucagon.  . COLONOSCOPY WITH PROPOFOL N/A 04/24/2016   Procedure: COLONOSCOPY WITH PROPOFOL;  Surgeon: Christene Lye, MD;  Location: [facility] ENDOSCOPY;  Service: Endoscopy;  Laterality: N/A;  . EYE SURGERY Bilateral 2010   cataract surgery.   . TONSILLECTOMY     Family History  Problem Relation Age of Onset  . Depression Mother   . Heart disease Father   . Early death Sister   . Alcohol abuse Brother   . Stroke Paternal Grandfather    Social History   Socioeconomic History  . Marital status: Single    Spouse name: Not on file  . Number of children: Not on file  . Years of education: Not on file  . Highest education level: Not on file  Occupational History  . Not on file  Tobacco Use  . Smoking status: Former Smoker    Types: Cigarettes  . Smokeless tobacco: Never Used  Substance and Sexual Activity  . Alcohol use: No  . Drug use: No  . Sexual activity: Never  Other Topics Concern  . Not on file  Social History Narrative  . Not on file   Social Determinants of Health   Financial Resource Strain:   . Difficulty of Paying Living Expenses: Not on file  Food Insecurity:   . Worried About Estate manager/land agent  of Food in the Last Year: Not on file  . Ran Out of Food in the Last Year: Not on file  Transportation Needs:   . Lack of Transportation (Medical): Not on file  . Lack of Transportation (Non-Medical): Not on file  Physical Activity:   . Days of Exercise per Week: Not on file  . Minutes of Exercise per Session: Not on file  Stress:   . Feeling of Stress : Not on file  Social Connections:   . Frequency of Communication with Friends and Family: Not on file  . Frequency of Social Gatherings with Friends and Family: Not on file  . Attends Religious Services: Not on file  . Active Member of Clubs or Organizations: Not on file  . Attends English as a second language teacher Meetings: Not on file  . Marital Status: Not on file    Outpatient Encounter Medications as of 04/02/2019  Medication Sig  . aspirin EC 81 MG tablet Take 81 mg by mouth daily.  . Calcium Carbonate-Vitamin D (CALCIUM PLUS VITAMIN D PO) Take by mouth.  . chlorhexidine (PERIDEX) 0.12 % solution RINSE MOUTH WITH 15 MLS (1 CAPFUL) FOR 30 SECONDS AM AND PM AFTER BRUSHING TEETH. EXPECTORATE AFTER RINSING. DO NOT SWALLOW  . cholecalciferol (VITAMIN D) 1000 units tablet Take 1,000 Units by mouth daily.  . EUTHYROX 100 MCG tablet TAKE 1 TABLET BY MOUTH ONCE DAILY BEFORE BREAKFAST  . ferrous sulfate 325 (65 FE) MG tablet Take 325 mg by mouth daily with breakfast.  . lisinopril-hydrochlorothiazide (ZESTORETIC) 20-25 MG tablet Take 1 tablet by mouth once daily  . Omega-3 Fatty Acids (FISH OIL) 1200 MG CAPS Take by mouth.  . simvastatin (ZOCOR) 40 MG tablet TAKE 1 TABLET BY MOUTH ONCE DAILY AT  6  PM  . zolpidem (AMBIEN) 5 MG tablet TAKE 1 TABLET BY MOUTH AT BEDTIME AS NEEDED FOR SLEEP  . [DISCONTINUED] niacin 500 MG tablet Take 500 mg by mouth at bedtime.  . [DISCONTINUED] Turmeric Curcumin 500 MG CAPS Take by mouth.   No facility-administered encounter medications on file as of 04/02/2019.    Activities of Daily Living In your present state of health, do you have any difficulty performing the following activities: 04/02/2019  Hearing? N  Vision? N  Difficulty concentrating or making decisions? N  Walking or climbing stairs? N  Dressing or bathing? N  Doing errands, shopping? N  Preparing Food and eating ? N  Using the Toilet? N  In the past six months, have you accidently leaked urine? N  Do you have problems with loss of bowel control? N  Managing your Medications? N  Managing your Finances? N  Housekeeping or managing your Housekeeping? N  Some recent data might be hidden    Patient Care Team: Leone Haven, MD as PCP - General (Family Medicine) Christene Lye,  MD (General Surgery)    Assessment:   This is a routine wellness examination for Tammy Dalton.  Nurse connected with patient 04/02/19 at 11:30 AM EST by a telephone enabled telemedicine application and verified that I am speaking with the correct person using two identifiers. Patient stated full name and DOB. Patient gave permission to continue with virtual visit. Patient's location was at home and Nurse's location was at [facility] office.   Patient is alert and oriented x3. Patient denies difficulty focusing or concentrating. Recall 3/3 words. Patient notes she works 3 days weekly and watches television which helps with brain stimulation.   Health Maintenance Due: See  completed HM at the end of note.   Eye: Visual acuity not assessed. Virtual visit. Followed by their ophthalmologist.  Dental: Visits every 12 months.    Hearing: Demonstrates normal hearing during visit.  Safety:  Patient feels safe at home- yes Patient does have smoke detectors at home- yes Patient does wear sunscreen or protective clothing when in direct sunlight - yes Patient does wear seat belt when in a moving vehicle - yes Patient drives- yes Adequate lighting in walkways free from debris- yes Grab bars and handrails used as appropriate- yes Ambulates with an assistive device- no Cell phone on person when ambulating outside of the home- yes  Social: Alcohol intake - no    Smoking history- former  Smokers in home? none Illicit drug use? none  Medication: Taking as directed and without issues.  Self managed - yes   Covid-19: Precautions and sickness symptoms discussed. Wears mask, social distancing, hand hygiene as appropriate.   Activities of Daily Living Patient denies needing assistance with: household chores, feeding themselves, getting from bed to chair, getting to the toilet, bathing/showering, dressing, managing money, or preparing meals.   Discussed the importance of a healthy diet, water intake  and the benefits of aerobic exercise.  Physical activity- Walking 3 days weekly  Diet:  Regular Water: fair intake Caffeine: tea in the evenings  Other Providers Patient Care Team: Leone Haven, MD as PCP - General (Family Medicine) Christene Lye, MD (General Surgery)  Exercise Activities and Dietary recommendations Current Exercise Habits: Home exercise routine, Type of exercise: walking, Time (Minutes): 30, Frequency (Times/Week): 3, Weekly Exercise (Minutes/Week): 90, Intensity: Mild  Goals      Patient Stated   . Follow up with Primary Care Provider (pt-stated)     As needed       Fall Risk Fall Risk  04/02/2019 12/16/2018 01/13/2018 12/24/2016 12/24/2016  Falls in the past year? 0 0 Yes No No  Number falls in past yr: - 0 1 - -  Injury with Fall? - - Yes - -  Comment - - She hurt her head and sought medical attention after slipping on wet grass, followed up with pcp.  - -  Follow up Falls prevention discussed Falls evaluation completed Falls prevention discussed - -    Timed Get Up and Go performed: no, virtual visit  Depression Screen PHQ 2/9 Scores 04/02/2019 12/16/2018 01/13/2018 12/24/2016  PHQ - 2 Score 0 0 0 0     Cognitive Function MMSE - Mini Mental State Exam 01/13/2018 12/24/2016  Orientation to time 5 5  Orientation to Place 5 5  Registration 3 3  Attention/ Calculation 5 5  Recall 3 3  Language- name 2 objects 2 2  Language- repeat 1 1  Language- follow 3 step command 3 3  Language- read & follow direction 1 1  Write a sentence 1 1  Copy design 1 1  Total score 30 30        Immunization History  Administered Date(s) Administered  . Influenza, High Dose Seasonal PF 12/24/2016, 12/02/2017, 12/15/2018  . Influenza-Unspecified 11/25/2015, 12/02/2017  . Pneumococcal Conjugate-13 06/28/2017  . Pneumococcal Polysaccharide-23 06/30/2018  . Pneumococcal-Unspecified 05/25/2011  . Tdap 08/01/2015, 04/13/2017  . Zoster 03/27/2007  . Zoster  Recombinat (Shingrix) 06/13/2017, 09/13/2017   Screening Tests Health Maintenance  Topic Date Due  . MAMMOGRAM  08/31/2020  . COLONOSCOPY  04/24/2026  . TETANUS/TDAP  04/14/2027  . INFLUENZA VACCINE  Completed  . DEXA SCAN  Completed  . Hepatitis C Screening  Completed  . PNA vac Low Risk Adult  Completed      Plan:   Keep all routine maintenance appointments.   Follow up 08/03/19 @ 3:30  Nurse note: Patient read AVS after visit on 04/01/19 and was surprised to see the words "stage 3 kidney disease". Requests clarity from physician in regards to her health and what this means. She does understand pcp instructions to drink more water and await lab results with the possibility of follow up with nephrology.  Medicare Attestation I have personally reviewed: The patient's medical and social history Their use of alcohol, tobacco or illicit drugs Their current medications and supplements The patient's functional ability including ADLs,fall risks, home safety risks, cognitive, and hearing and visual impairment Diet and physical activities Evidence for depression    have reviewed and discussed with patient certain preventive protocols, quality metrics, and best practice recommendations.     Varney Biles, LPN  [phone]

## 2019-04-09 ENCOUNTER — Telehealth: Payer: Self-pay | Admitting: Family Medicine

## 2019-04-09 DIAGNOSIS — N1831 Chronic kidney disease, stage 3a: Secondary | ICD-10-CM

## 2019-04-09 NOTE — Telephone Encounter (Signed)
Ordered

## 2019-04-09 NOTE — Telephone Encounter (Signed)
Pt called back wanting to know if the order for an ultrasound of her kidneys has been placed

## 2019-04-21 ENCOUNTER — Ambulatory Visit
Admission: RE | Admit: 2019-04-21 | Discharge: 2019-04-21 | Disposition: A | Discharge status: Home / Self Care | Payer: Medicare Other | Source: Ambulatory Visit | Attending: Family Medicine | Admitting: Family Medicine

## 2019-04-21 ENCOUNTER — Other Ambulatory Visit: Payer: Self-pay

## 2019-04-21 DIAGNOSIS — N183 Chronic kidney disease, stage 3 unspecified: Secondary | ICD-10-CM | POA: Diagnosis not present

## 2019-04-21 DIAGNOSIS — N1831 Chronic kidney disease, stage 3a: Secondary | ICD-10-CM | POA: Insufficient documentation

## 2019-04-21 DIAGNOSIS — N281 Cyst of kidney, acquired: Secondary | ICD-10-CM | POA: Diagnosis not present

## 2019-05-04 DIAGNOSIS — Z961 Presence of intraocular lens: Secondary | ICD-10-CM | POA: Diagnosis not present

## 2019-05-11 ENCOUNTER — Other Ambulatory Visit: Payer: Self-pay | Admitting: Family Medicine

## 2019-05-19 ENCOUNTER — Other Ambulatory Visit: Payer: Self-pay | Admitting: Family Medicine

## 2019-05-20 NOTE — Telephone Encounter (Signed)
Refill request for Tammy Dalton, last seen 04-01-19, last filled 03-30-19.  Please advise.

## 2019-05-31 ENCOUNTER — Other Ambulatory Visit: Payer: Self-pay | Admitting: Family Medicine

## 2019-06-01 ENCOUNTER — Telehealth: Payer: Self-pay | Admitting: Family Medicine

## 2019-06-01 MED ORDER — ZOLPIDEM TARTRATE 5 MG PO TABS: 5.0000 mg | ORAL_TABLET | Freq: Every evening | ORAL | 0 refills | Status: DC | PRN

## 2019-06-01 NOTE — Telephone Encounter (Signed)
Pt said [facility] on [address] was faxing over Ambien prescription to be filled for patient. She said they sent it a week ago and again last night.

## 2019-06-01 NOTE — Telephone Encounter (Signed)
Sent to pharmacy 

## 2019-06-01 NOTE — Telephone Encounter (Signed)
Refill request for Tammy Dalton, last seen 04-01-19, last filled 03-30-19.  Please advise.

## 2019-06-05 ENCOUNTER — Telehealth: Payer: Self-pay | Admitting: Family Medicine

## 2019-06-05 MED ORDER — ZOLPIDEM TARTRATE 5 MG PO TABS: 5.0000 mg | ORAL_TABLET | Freq: Every evening | ORAL | 0 refills | Status: DC | PRN

## 2019-06-05 NOTE — Telephone Encounter (Signed)
Pt states that the pharmacy did not receive the medication refill.

## 2019-06-05 NOTE — Telephone Encounter (Signed)
Re-sent to pharmacy.

## 2019-06-05 NOTE — Addendum Note (Signed)
Addended by: Caryl Bis Tiffnay Bossi G on: 06/05/2019 11:52 AM   Modules accepted: Orders

## 2019-06-08 NOTE — Telephone Encounter (Signed)
err

## 2019-06-15 ENCOUNTER — Ambulatory Visit: Payer: Medicare Other | Admitting: Family Medicine

## 2019-07-10 ENCOUNTER — Other Ambulatory Visit: Payer: Self-pay

## 2019-07-10 ENCOUNTER — Encounter: Payer: Self-pay | Admitting: Family

## 2019-07-10 ENCOUNTER — Ambulatory Visit (INDEPENDENT_AMBULATORY_CARE_PROVIDER_SITE_OTHER): Payer: Medicare Other | Admitting: Family

## 2019-07-10 VITALS — BP 136/70 | HR 70 | Temp 96.9°F | Ht 62.0 in | Wt 137.8 lb

## 2019-07-10 DIAGNOSIS — M25522 Pain in left elbow: Secondary | ICD-10-CM | POA: Insufficient documentation

## 2019-07-10 MED ORDER — CEPHALEXIN 500 MG PO CAPS: 500.0000 mg | ORAL_CAPSULE | Freq: Four times a day (QID) | ORAL | 0 refills | Status: DC

## 2019-07-10 NOTE — Progress Notes (Signed)
Subjective:    Patient ID: Tammy Dalton, female    DOB: 1945/12/26, 74 y.o.   MRN: [id number]  CC: Tammy Dalton is a 74 y.o. female who presents today for an acute visit.    HPI: Left elbow pain x 2 days, worsening. Last night notes throb and couldn't sleep on elbow. No pain with bending. Pain with palpation.   Pain feels similar to pain she had in left elbow in March 2020 ; pain resolved with keflex.   No injury . Doesn't drive with elbow on door frame.   No h/o gout, joint replacement.   No fever, rash, open wound.   No h/o dm.     No alcohol No ckd H/o htn, hypothyroidism, hld  No h/o mrsa  HISTORY:  Past Medical History:  Diagnosis Date  . Hyperlipidemia   . Hypertension   . Thyroid disease    Past Surgical History:  Procedure Laterality Date  . anal sphincterotomy and fissurectomy  2009  . COLONOSCOPY  05/24/2011   Dr. Jill Side done at [facility]  . COLONOSCOPY  05/20/2000   Dr. Jamal Collin; Sharp bend at 50 cm. Scope could not be advanced any further even with glucagon.  . COLONOSCOPY WITH PROPOFOL N/A 04/24/2016   Procedure: COLONOSCOPY WITH PROPOFOL;  Surgeon: Christene Lye, MD;  Location: [facility] ENDOSCOPY;  Service: Endoscopy;  Laterality: N/A;  . EYE SURGERY Bilateral 2010   cataract surgery.   . TONSILLECTOMY     Family History  Problem Relation Age of Onset  . Depression Mother   . Heart disease Father   . Early death Sister   . Alcohol abuse Brother   . Stroke Paternal Grandfather     Allergies: Codeine Current Outpatient Medications on File Prior to Visit  Medication Sig Dispense Refill  . aspirin EC 81 MG tablet Take 81 mg by mouth daily. Taking every other day.    . cholecalciferol (VITAMIN D) 1000 units tablet Take 1,000 Units by mouth daily.    . EUTHYROX 100 MCG tablet TAKE 1 TABLET BY MOUTH ONCE DAILY BEFORE BREAKFAST 90 tablet 0  . ferrous sulfate 325 (65 FE) MG tablet Take 325 mg by mouth daily  with breakfast.    . lisinopril-hydrochlorothiazide (ZESTORETIC) 20-25 MG tablet Take 1 tablet by mouth once daily 90 tablet 0  . Omega-3 Fatty Acids (FISH OIL) 1200 MG CAPS Take by mouth.    . simvastatin (ZOCOR) 40 MG tablet TAKE 1 TABLET BY MOUTH ONCE DAILY AT  6PM 90 tablet 0  . zolpidem (AMBIEN) 5 MG tablet Take 1 tablet (5 mg total) by mouth at bedtime as needed. for sleep 90 tablet 0   No current facility-administered medications on file prior to visit.    Social History   Tobacco Use  . Smoking status: Former Smoker    Types: Cigarettes  . Smokeless tobacco: Never Used  Substance Use Topics  . Alcohol use: No  . Drug use: No    Review of Systems  Constitutional: Negative for chills and fever.  Respiratory: Negative for cough.   Cardiovascular: Negative for chest pain and palpitations.  Gastrointestinal: Negative for nausea and vomiting.  Musculoskeletal: Positive for arthralgias. Negative for joint swelling and myalgias.      Objective:    BP 136/70 (BP Location: Right Arm, Cuff Size: Normal)   Pulse 70   Temp (!) 96.9 F (36.1 C) (Temporal)   Ht 5\' 2"  (1.575 m)   Wt  137 lb 12.8 oz (62.5 kg)   SpO2 96%   BMI 25.20 kg/m   BP Readings from Last 3 Encounters:  07/10/19 136/70  04/01/19 130/70  06/10/18 118/64    Physical Exam Vitals reviewed.  Constitutional:      Appearance: She is well-developed.  Eyes:     Conjunctiva/sclera: Conjunctivae normal.  Cardiovascular:     Rate and Rhythm: Normal rate and regular rhythm.     Pulses: Normal pulses.     Heart sounds: Normal heart sounds.  Pulmonary:     Effort: Pulmonary effort is normal.     Breath sounds: Normal breath sounds. No wheezing, rhonchi or rales.  Musculoskeletal:     Right elbow: Normal. No swelling or deformity.     Left elbow: No swelling. Normal range of motion. Tenderness present.       Arms:     Comments: Area noted posterior olecranon of slight tenderness. Full ROM with flexion,  extension. No pain with movement. Skin is not fluctuant.  No erythema, rash, increased warmth.   Skin:    General: Skin is warm and dry.  Neurological:     Mental Status: She is alert.  Psychiatric:        Speech: Speech normal.        Behavior: Behavior normal.        Thought Content: Thought content normal.         Assessment & Plan:   Problem List Items Addressed This Visit      Other   Left elbow pain - Primary    Overall benign exam. Discussed differentials early cellulitis, gout. No pain in joint itself. Low suspicion for septic joint.  Patient will remain very vigilant, particularly as over the weekend, and if any worsening seek immediate medical care.  she will start keflex with probiotics.       Relevant Medications   cephALEXin (KEFLEX) 500 MG capsule   Other Relevant Orders   CBC with Differential/Platelet   Basic metabolic panel   Sedimentation rate   C-reactive protein   Uric acid       I have discontinued Rise Paganini D. Nakajima "[name]"'s Calcium Carbonate-Vitamin D (CALCIUM PLUS VITAMIN D PO) and chlorhexidine. I am also having her start on cephALEXin. Additionally, I am having her maintain her cholecalciferol, Fish Oil, aspirin EC, ferrous sulfate, lisinopril-hydrochlorothiazide, simvastatin, Euthyrox, and zolpidem.   Meds ordered this encounter  Medications  . cephALEXin (KEFLEX) 500 MG capsule    Sig: Take 1 capsule (500 mg total) by mouth every 6 (six) hours.    Dispense:  28 capsule    Refill:  0    Order Specific Question:   Supervising Provider    Answer:   Crecencio Mc [[id number]]    Return precautions given.   Risks, benefits, and alternatives of the medications and treatment plan prescribed today were discussed, and patient expressed understanding.   Education regarding symptom management and diagnosis given to patient on AVS.  Continue to follow with Leone Haven, MD for routine health maintenance.   Golden Pop and I agreed with plan.     Mable Paris, FNP

## 2019-07-10 NOTE — Patient Instructions (Addendum)
Monitor blood pressure, goal less than 130/80. Suspect pain is increasing. If continue to be elevated please call the office immediately.  Start keflex  Ensure to take probiotics while on antibiotics and also for 2 weeks after completion. It is important to re-colonize the gut with good bacteria and also to prevent any diarrheal infections associated with antibiotic use.   Stay VERY vigilant and let me know of any concerns. If over weekend , you see any progression of pain, redness, fever, rash, you need to go to an urgent care.    Cellulitis, Adult  Cellulitis is a skin infection. The infected area is often warm, red, swollen, and sore. It occurs most often in the arms and lower legs. It is very important to get treated for this condition. What are the causes? This condition is caused by bacteria. The bacteria enter through a break in the skin, such as a cut, burn, insect bite, open sore, or crack. What increases the risk? This condition is more likely to occur in people who:  Have a weak body defense system (immune system).  Have open cuts, burns, bites, or scrapes on the skin.  Are older than 73 years of age.  Have a blood sugar problem (diabetes).  Have a long-lasting (chronic) liver disease (cirrhosis) or kidney disease.  Are very overweight (obese).  Have a skin problem, such as: ? Itchy rash (eczema). ? Slow movement of blood in the veins (venous stasis). ? Fluid buildup below the skin (edema).  Have been treated with high-energy rays (radiation).  Use IV drugs. What are the signs or symptoms? Symptoms of this condition include:  Skin that is: ? Red. ? Streaking. ? Spotting. ? Swollen. ? Sore or painful when you touch it. ? Warm.  A fever.  Chills.  Blisters. How is this diagnosed? This condition is diagnosed based on:  Medical history.  Physical exam.  Blood tests.  Imaging tests. How is this treated? Treatment for this condition may  include:  Medicines to treat infections or allergies.  Home care, such as: ? Rest. ? Placing cold or warm cloths (compresses) on the skin.  Hospital care, if the condition is very bad. Follow these instructions at home: Medicines  Take over-the-counter and prescription medicines only as told by your doctor.  If you were prescribed an antibiotic medicine, take it as told by your doctor. Do not stop taking it even if you start to feel better. General instructions   Drink enough fluid to keep your pee (urine) pale yellow.  Do not touch or rub the infected area.  Raise (elevate) the infected area above the level of your heart while you are sitting or lying down.  Place cold or warm cloths on the area as told by your doctor.  Keep all follow-up visits as told by your doctor. This is important. Contact a doctor if:  You have a fever.  You do not start to get better after 1-2 days of treatment.  Your bone or joint under the infected area starts to hurt after the skin has healed.  Your infection comes back. This can happen in the same area or another area.  You have a swollen bump in the area.  You have new symptoms.  You feel ill and have muscle aches and pains. Get help right away if:  Your symptoms get worse.  You feel very sleepy.  You throw up (vomit) or have watery poop (diarrhea) for a long time.  You see red  streaks coming from the area.  Your red area gets larger.  Your red area turns dark in color. These symptoms may represent a serious problem that is an emergency. Do not wait to see if the symptoms will go away. Get medical help right away. Call your local emergency services (911 in the [place] Do not drive yourself to the hospital. Summary  Cellulitis is a skin infection. The area is often warm, red, swollen, and sore.  This condition is treated with medicines, rest, and cold and warm cloths.  Take all medicines only as told by your doctor.  Tell  your doctor if symptoms do not start to get better after 1-2 days of treatment. This information is not intended to replace advice given to you by your health care provider. Make sure you discuss any questions you have with your health care provider. Document Revised: 08/01/2017 Document Reviewed: 08/01/2017 Elsevier Patient Education  [place].

## 2019-07-10 NOTE — Assessment & Plan Note (Signed)
Overall benign exam. Discussed differentials early cellulitis, gout. No pain in joint itself. Low suspicion for septic joint.  Patient will remain very vigilant, particularly as over the weekend, and if any worsening seek immediate medical care.  she will start keflex with probiotics.

## 2019-07-11 LAB — CBC WITH DIFFERENTIAL/PLATELET
Absolute Monocytes: 608 cells/uL (ref 200–950)
Basophils Absolute: 69 cells/uL (ref 0–200)
Basophils Relative: 0.9 %
Eosinophils Absolute: 508 cells/uL — ABNORMAL HIGH (ref 15–500)
Eosinophils Relative: 6.6 %
HCT: 38.6 % (ref 35.0–45.0)
Hemoglobin: 12.5 g/dL (ref 11.7–15.5)
Lymphs Abs: 1910 cells/uL (ref 850–3900)
MCH: 29.1 pg (ref 27.0–33.0)
MCHC: 32.4 g/dL (ref 32.0–36.0)
MCV: 89.8 fL (ref 80.0–100.0)
MPV: 11.1 fL (ref 7.5–12.5)
Monocytes Relative: 7.9 %
Neutro Abs: 4605 cells/uL (ref 1500–7800)
Neutrophils Relative %: 59.8 %
Platelets: 221 10*3/uL (ref 140–400)
RBC: 4.3 10*6/uL (ref 3.80–5.10)
RDW: 13.8 % (ref 11.0–15.0)
Total Lymphocyte: 24.8 %
WBC: 7.7 10*3/uL (ref 3.8–10.8)

## 2019-07-11 LAB — BASIC METABOLIC PANEL
BUN/Creatinine Ratio: 21 (calc) (ref 6–22)
BUN: 22 mg/dL (ref 7–25)
CO2: 24 mmol/L (ref 20–32)
Calcium: 9.4 mg/dL (ref 8.6–10.4)
Chloride: 106 mmol/L (ref 98–110)
Creat: 1.07 mg/dL — ABNORMAL HIGH (ref 0.60–0.93)
Glucose, Bld: 156 mg/dL — ABNORMAL HIGH (ref 65–99)
Potassium: 3.5 mmol/L (ref 3.5–5.3)
Sodium: 143 mmol/L (ref 135–146)

## 2019-07-11 LAB — C-REACTIVE PROTEIN: CRP: 1.1 mg/L (ref <8.0)

## 2019-07-11 LAB — URIC ACID: Uric Acid, Serum: 7.6 mg/dL — ABNORMAL HIGH (ref 2.5–7.0)

## 2019-07-11 LAB — SEDIMENTATION RATE: Sed Rate: 2 mm/h (ref 0–30)

## 2019-08-03 ENCOUNTER — Ambulatory Visit (INDEPENDENT_AMBULATORY_CARE_PROVIDER_SITE_OTHER): Payer: Medicare Other | Admitting: Family Medicine

## 2019-08-03 ENCOUNTER — Encounter: Payer: Self-pay | Admitting: Family Medicine

## 2019-08-03 ENCOUNTER — Other Ambulatory Visit: Payer: Self-pay

## 2019-08-03 VITALS — BP 120/70 | HR 72 | Temp 97.9°F | Ht 62.0 in | Wt 136.8 lb

## 2019-08-03 DIAGNOSIS — M25522 Pain in left elbow: Secondary | ICD-10-CM | POA: Diagnosis not present

## 2019-08-03 DIAGNOSIS — G479 Sleep disorder, unspecified: Secondary | ICD-10-CM

## 2019-08-03 DIAGNOSIS — E790 Hyperuricemia without signs of inflammatory arthritis and tophaceous disease: Secondary | ICD-10-CM

## 2019-08-03 DIAGNOSIS — R7309 Other abnormal glucose: Secondary | ICD-10-CM | POA: Diagnosis not present

## 2019-08-03 DIAGNOSIS — E039 Hypothyroidism, unspecified: Secondary | ICD-10-CM

## 2019-08-03 DIAGNOSIS — I10 Essential (primary) hypertension: Secondary | ICD-10-CM | POA: Diagnosis not present

## 2019-08-03 NOTE — Patient Instructions (Signed)
Nice to see you. We will check labs today.  

## 2019-08-03 NOTE — Assessment & Plan Note (Signed)
Resolved.  Recheck uric acid.

## 2019-08-03 NOTE — Assessment & Plan Note (Signed)
Check A1c. 

## 2019-08-03 NOTE — Assessment & Plan Note (Signed)
Well-controlled.  Continue current regimen. 

## 2019-08-03 NOTE — Progress Notes (Signed)
  [name], MD Phone: [phone]  Tammy Dalton is a 74 y.o. female who presents today for f/u.  HYPERTENSION  Disease Monitoring  Home BP Monitoring not checking Chest pain- no    Dyspnea- no Medications  Compliance-  Taking lisinopril/HCTZ.   Edema- no  HYPOTHYROIDISM Disease Monitoring Weight changes: no  Skin Changes: no Heat/Cold intolerance: no  Medication Monitoring Compliance:  Taking euthyrox   Last TSH:   Lab Results  Component Value Date   TSH 1.12 12/23/2018   Insomnia: Takes Ambien nightly.  No drowsiness the next day.  No alcohol intake.  To get 6 to 7 hours of sleep.  Left elbow pain: Patient notes this resolved.  She completed the Keflex.  Uric acid was mildly elevated.    Social History   Tobacco Use  Smoking Status Former Smoker  . Types: Cigarettes  Smokeless Tobacco Never Used     ROS see history of present illness  Objective  Physical Exam Vitals:   08/03/19 1541  BP: 120/70  Pulse: 72  Temp: 97.9 F (36.6 C)  SpO2: 96%    BP Readings from Last 3 Encounters:  08/03/19 120/70  07/10/19 136/70  04/01/19 130/70   Wt Readings from Last 3 Encounters:  08/03/19 136 lb 12.8 oz (62.1 kg)  07/10/19 137 lb 12.8 oz (62.5 kg)  04/02/19 137 lb (62.1 kg)    Physical Exam Constitutional:      General: She is not in acute distress.    Appearance: She is not diaphoretic.  Cardiovascular:     Rate and Rhythm: Normal rate and regular rhythm.     Heart sounds: Normal heart sounds.  Pulmonary:     Effort: Pulmonary effort is normal.     Breath sounds: Normal breath sounds.  Musculoskeletal:     Comments: Left elbow near the olecranon process with no tenderness, warmth, erythema, or swelling  Skin:    General: Skin is warm and dry.  Neurological:     Mental Status: She is alert.      Assessment/Plan: Please see individual problem list.  Hypertension Well-controlled.  Continue current regimen.  Hypothyroidism Check TSH.   Continue current medication.  Sleeping difficulty Continue Ambien.  Tolerating well.  Left elbow pain Resolved.  Recheck uric acid.  Elevated glucose Check A1c.   Orders Placed This Encounter  Procedures  . Uric acid  . TSH  . HgB A1c    No orders of the defined types were placed in this encounter.   This visit occurred during the SARS-CoV-2 public health emergency.  Safety protocols were in place, including screening questions prior to the visit, additional usage of staff PPE, and extensive cleaning of exam room while observing appropriate contact time as indicated for disinfecting solutions.    [name], MD [place]

## 2019-08-03 NOTE — Assessment & Plan Note (Signed)
Check TSH.  Continue current medication.

## 2019-08-03 NOTE — Assessment & Plan Note (Signed)
Continue Ambien.  Tolerating well.

## 2019-08-04 LAB — URIC ACID: Uric Acid, Serum: 7.4 mg/dL — ABNORMAL HIGH (ref 2.4–7.0)

## 2019-08-04 LAB — HEMOGLOBIN A1C: Hgb A1c MFr Bld: 5.6 % (ref 4.6–6.5)

## 2019-08-04 LAB — TSH: TSH: 0.56 u[IU]/mL (ref 0.35–4.50)

## 2019-08-17 ENCOUNTER — Other Ambulatory Visit: Payer: Self-pay | Admitting: Family Medicine

## 2019-08-18 ENCOUNTER — Other Ambulatory Visit: Payer: Self-pay | Admitting: Family Medicine

## 2019-09-06 DIAGNOSIS — S8002XA Contusion of left knee, initial encounter: Secondary | ICD-10-CM | POA: Diagnosis not present

## 2019-09-06 DIAGNOSIS — S8992XA Unspecified injury of left lower leg, initial encounter: Secondary | ICD-10-CM | POA: Diagnosis not present

## 2019-09-06 DIAGNOSIS — M25562 Pain in left knee: Secondary | ICD-10-CM | POA: Diagnosis not present

## 2019-10-30 ENCOUNTER — Telehealth: Payer: Self-pay | Admitting: Family Medicine

## 2019-10-30 NOTE — Telephone Encounter (Signed)
Pt states that she is having swelling in her elbow again and wants medication called in for cellulitis. Please call to advise

## 2019-11-01 ENCOUNTER — Other Ambulatory Visit: Payer: Self-pay | Admitting: Family Medicine

## 2019-11-02 NOTE — Telephone Encounter (Signed)
Patient stated her elbow is better now, she doesn't need any medication nor appointment.

## 2019-11-02 NOTE — Telephone Encounter (Signed)
Can you triage this please? 

## 2019-11-04 DIAGNOSIS — M180 Bilateral primary osteoarthritis of first carpometacarpal joints: Secondary | ICD-10-CM | POA: Diagnosis not present

## 2019-11-04 DIAGNOSIS — M25542 Pain in joints of left hand: Secondary | ICD-10-CM | POA: Diagnosis not present

## 2019-11-04 DIAGNOSIS — M25541 Pain in joints of right hand: Secondary | ICD-10-CM | POA: Diagnosis not present

## 2019-11-04 DIAGNOSIS — M151 Heberden's nodes (with arthropathy): Secondary | ICD-10-CM | POA: Diagnosis not present

## 2019-11-25 ENCOUNTER — Other Ambulatory Visit: Payer: Self-pay | Admitting: Family Medicine

## 2019-12-14 ENCOUNTER — Other Ambulatory Visit: Payer: Self-pay | Admitting: Family Medicine

## 2019-12-18 ENCOUNTER — Encounter: Payer: Self-pay | Admitting: Family Medicine

## 2019-12-27 ENCOUNTER — Other Ambulatory Visit: Payer: Self-pay | Admitting: Family Medicine

## 2019-12-30 ENCOUNTER — Other Ambulatory Visit: Payer: Self-pay | Admitting: Family Medicine

## 2019-12-30 NOTE — Telephone Encounter (Signed)
This was sent to the pharmacy 2 days ago.  Please call them and see if they received the prescription.  Thanks.

## 2019-12-31 ENCOUNTER — Other Ambulatory Visit: Payer: Self-pay | Admitting: Family Medicine

## 2020-01-04 ENCOUNTER — Telehealth: Payer: Self-pay

## 2020-01-04 MED ORDER — ZOLPIDEM TARTRATE 5 MG PO TABS: 5.0000 mg | ORAL_TABLET | Freq: Every evening | ORAL | 0 refills | Status: DC | PRN

## 2020-01-04 NOTE — Telephone Encounter (Signed)
Ambien did not go through electronically, the RX for Lorrin Mais was called in to the pharmacy. I spoke with the pharmacist and gave Information.  Greenley Martone,cma

## 2020-01-04 NOTE — Telephone Encounter (Signed)
Sent to pharmacy 

## 2020-01-04 NOTE — Telephone Encounter (Signed)
Pt states that Lorrin Mais has still not been received. Please call them ASAP. Pt took last pill

## 2020-01-04 NOTE — Addendum Note (Signed)
Addended by: Leone Haven on: 01/04/2020 04:54 PM   Modules accepted: Orders

## 2020-01-07 ENCOUNTER — Encounter: Payer: Self-pay | Admitting: Family Medicine

## 2020-01-14 IMAGING — CT CT HEAD W/O CM
3 series · 15 of 45 positions shown, 18 images · non-contrast
Comparison: No priors.

CLINICAL DATA: 71-year-old female with history of trauma from a
fall with laceration to the right side of the head today. Currently
on blood thinners. No associated loss of consciousness.

EXAM:
CT HEAD WITHOUT CONTRAST
TECHNIQUE: Contiguous axial images were obtained from the base of the skull
through the vertex without intravenous contrast.

[Series 2: head wo · axial · 0.47mm/px · z∈[-99,+16]mm · 9 of 28 slices shown, 12 images]
[im 3/28  brain]
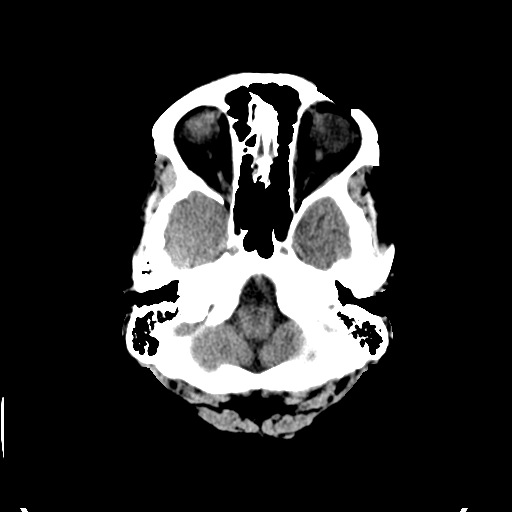
[im 3/28  bone]
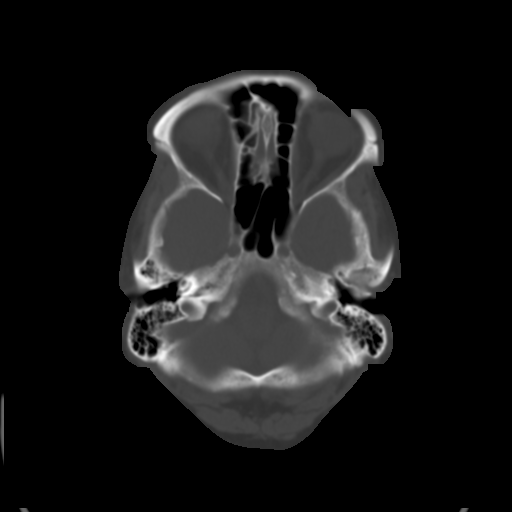
[im 6/28  brain]
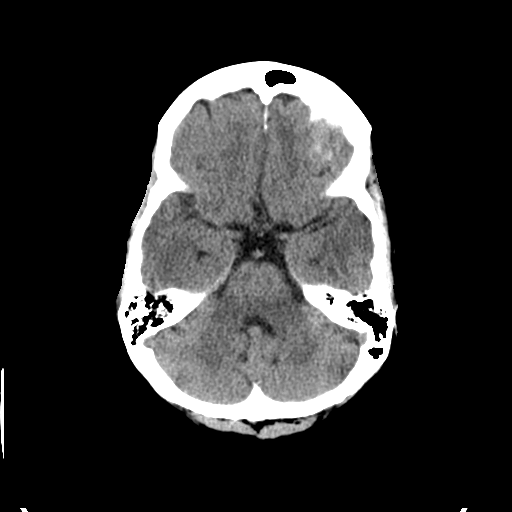
[im 9/28  brain]
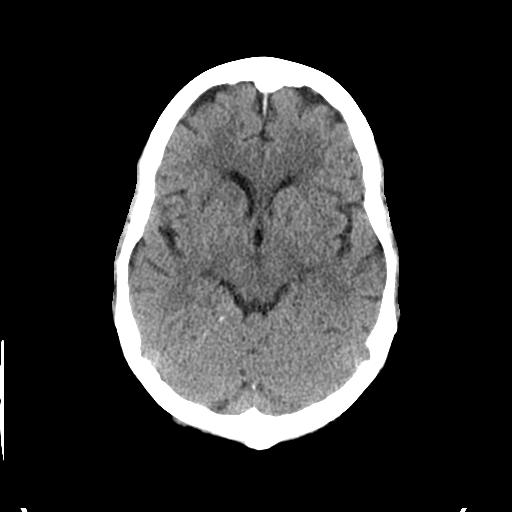
[im 12/28  brain]
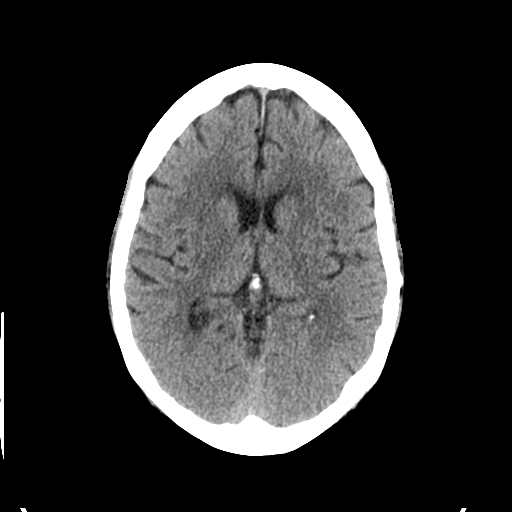
[im 15/28  brain]
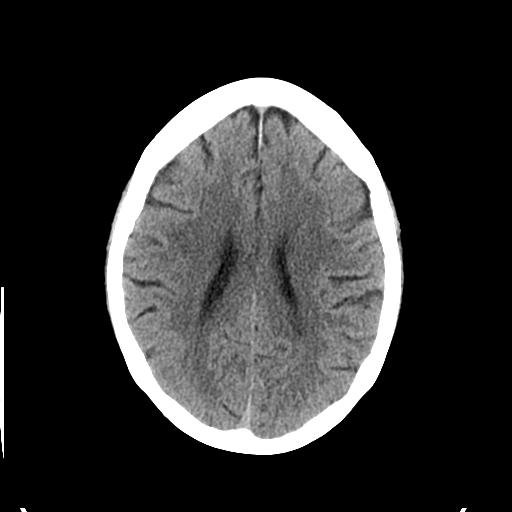
[im 15/28  bone]
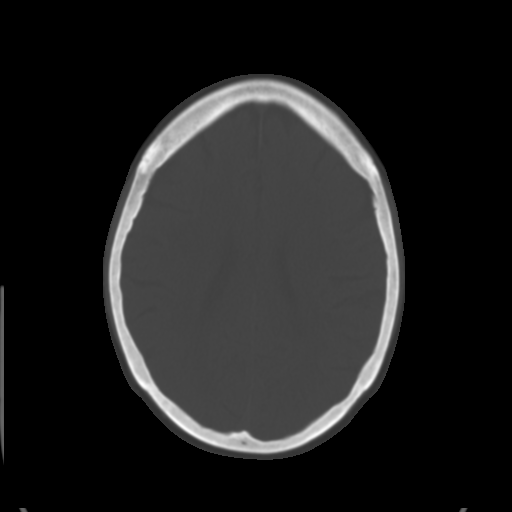
[im 17/28  brain]
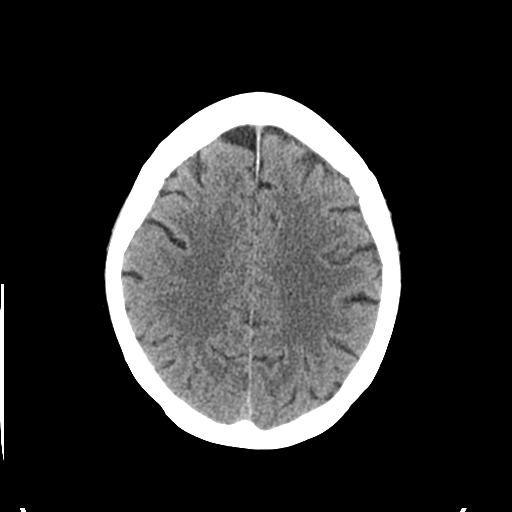
[im 20/28  brain]
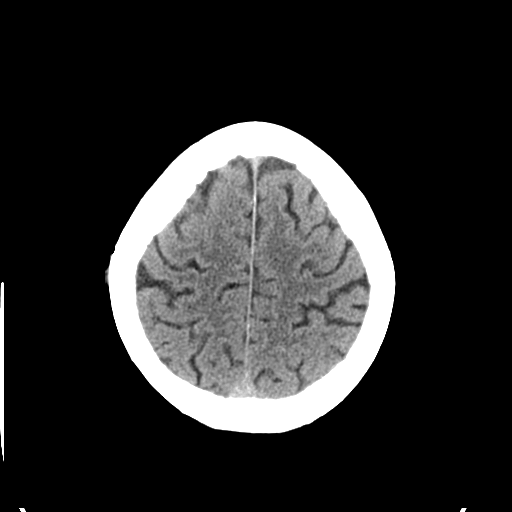
[im 23/28  brain]
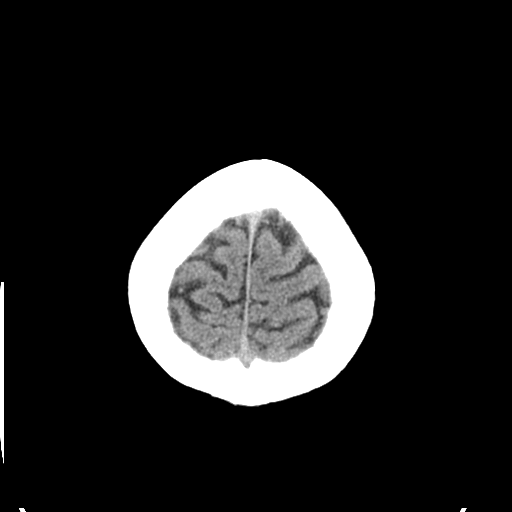
[im 26/28  brain]
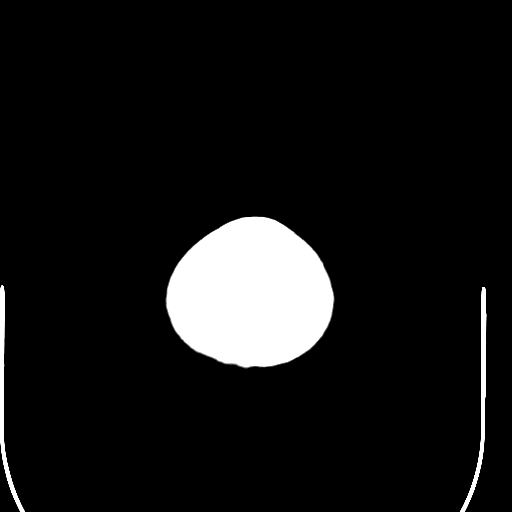
[im 26/28  bone]
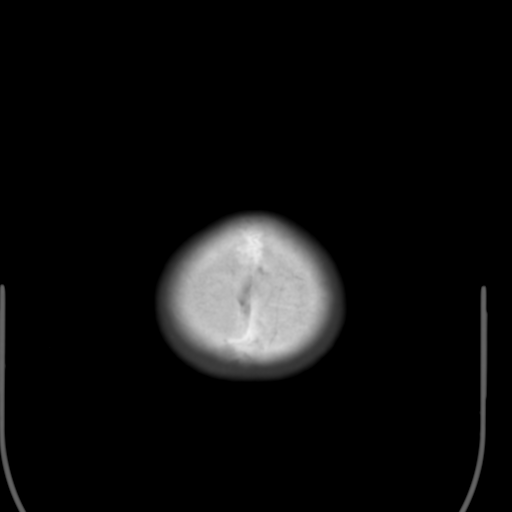

[Series 4: coronal soft tissue · coronal · 0.28mm/px · 3 of 64 slices shown]
[im 22/64  brain]
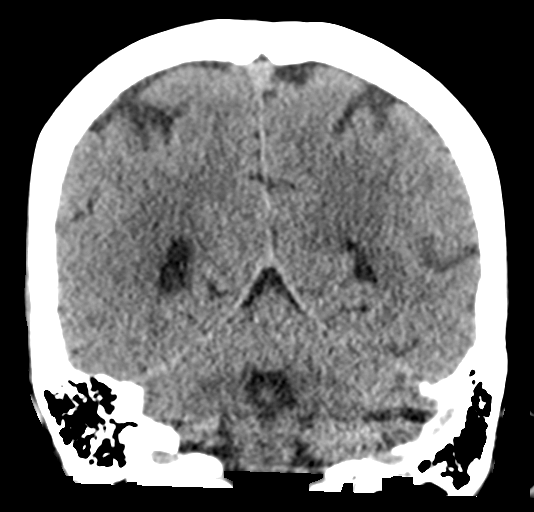
[im 29/64  brain]
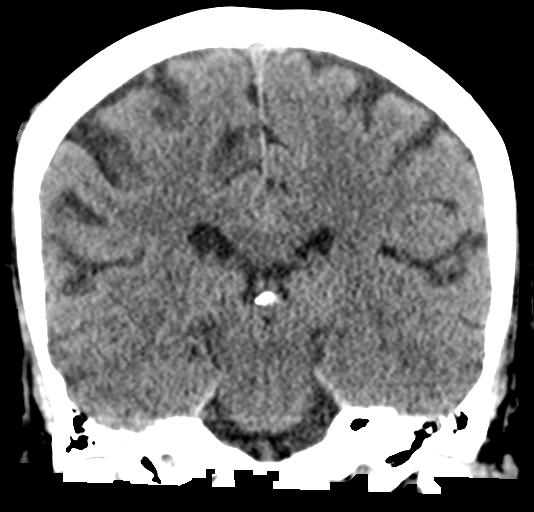
[im 36/64  brain]
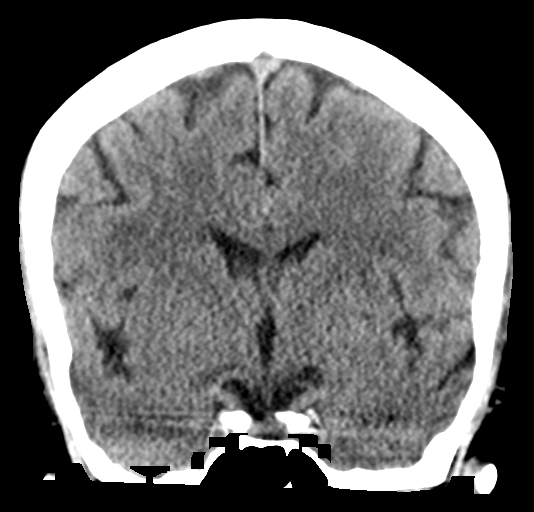

[Series 5: sagittal soft tissue · sagittal · 0.27mm/px · 3 of 51 slices shown]
[im 17/51  brain]
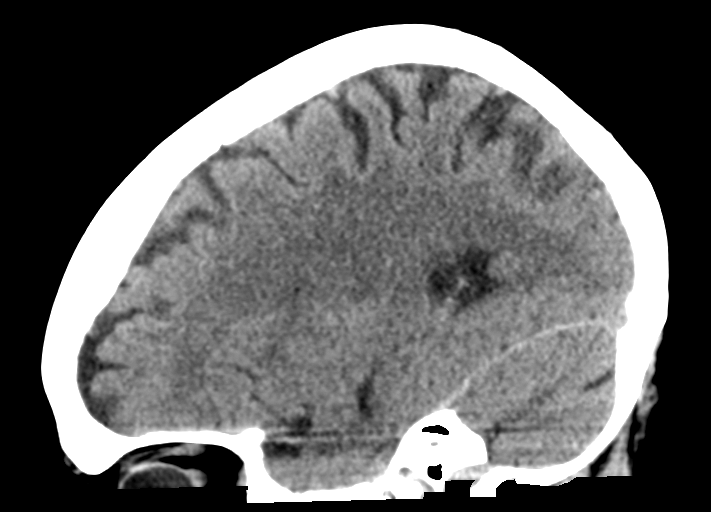
[im 26/51  brain]
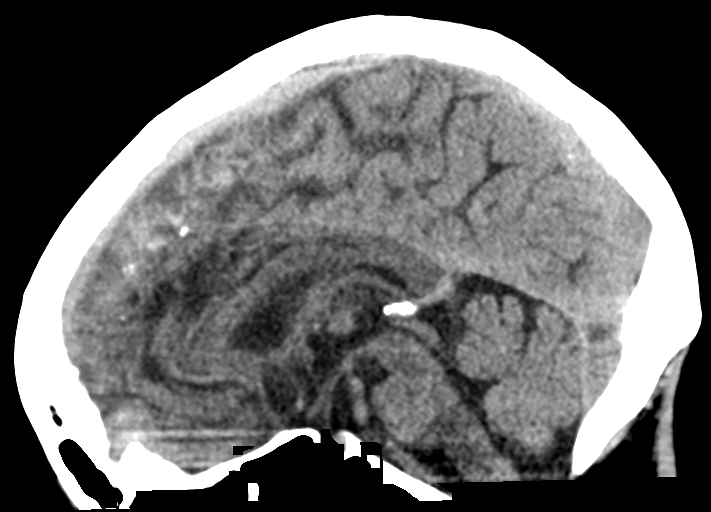
[im 34/51  brain]
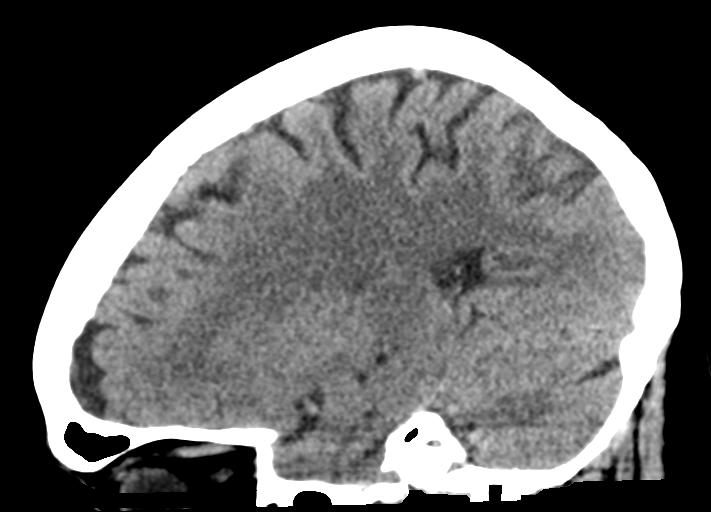

[15 of 45 positions shown; findings below may reference images not displayed]

FINDINGS: Brain: Patchy areas of decreased attenuation are noted throughout
the deep and periventricular white matter of the cerebral
hemispheres bilaterally, compatible with mild chronic microvascular
ischemic disease. No evidence of acute infarction, hemorrhage,
hydrocephalus, extra-axial collection or mass lesion/mass effect.

Vascular: No hyperdense vessel or unexpected calcification.

Skull: Normal. Negative for fracture or focal lesion.

Sinuses/Orbits: No acute finding.

Other: Soft tissue irregularity and gas in the right frontoparietal
scalp, presumably at the site of laceration.
IMPRESSION: 1. Probable right frontoparietal scalp laceration without evidence
of underlying acute displaced skull fracture or signs of significant
acute intracranial trauma.
2. Very mild chronic microvascular ischemic changes in the cerebral
white matter, as above.

## 2020-02-03 ENCOUNTER — Other Ambulatory Visit: Payer: Self-pay

## 2020-02-03 ENCOUNTER — Ambulatory Visit (INDEPENDENT_AMBULATORY_CARE_PROVIDER_SITE_OTHER): Payer: Medicare Other | Admitting: Family Medicine

## 2020-02-03 ENCOUNTER — Encounter: Payer: Self-pay | Admitting: Family Medicine

## 2020-02-03 DIAGNOSIS — E785 Hyperlipidemia, unspecified: Secondary | ICD-10-CM

## 2020-02-03 DIAGNOSIS — G479 Sleep disorder, unspecified: Secondary | ICD-10-CM | POA: Diagnosis not present

## 2020-02-03 DIAGNOSIS — E039 Hypothyroidism, unspecified: Secondary | ICD-10-CM | POA: Diagnosis not present

## 2020-02-03 DIAGNOSIS — I10 Essential (primary) hypertension: Secondary | ICD-10-CM | POA: Diagnosis not present

## 2020-02-03 NOTE — Assessment & Plan Note (Signed)
Check TSH.  Continue levothyroxine 100 mcg once daily.

## 2020-02-03 NOTE — Assessment & Plan Note (Signed)
Check lipid panel.  Continue simvastatin 40 mg once daily.

## 2020-02-03 NOTE — Assessment & Plan Note (Signed)
Stable.  Continue Ambien 5 mg once daily.

## 2020-02-03 NOTE — Assessment & Plan Note (Signed)
Well-controlled at home.  Continue lisinopril-HCTZ 20-25 mg once daily.  Labs today.

## 2020-02-03 NOTE — Progress Notes (Signed)
  [name], MD Phone: [phone]  Tammy Dalton is a 74 y.o. female who presents today for f/u.  HYPERTENSION  Disease Monitoring  Home BP Monitoring 120s/70s Chest pain- no    Dyspnea- no Medications  Compliance-  Taking lisinopril/HCTZ.   Edema- no  Insomnia: Ongoing issue.  Adequately treated with Ambien.  She gets 6 to 7 hours of sleep nightly and falls asleep much quicker with the Ambien.  No drowsiness the next day.  No alcohol intake with the Ambien.  No odd activities at night.    Social History   Tobacco Use  Smoking Status Former Smoker  . Types: Cigarettes  Smokeless Tobacco Never Used     ROS see history of present illness  Objective  Physical Exam Vitals:   02/03/20 1539  BP: 120/80  Pulse: 75  Temp: 98.5 F (36.9 C)  SpO2: 96%    BP Readings from Last 3 Encounters:  02/03/20 120/80  08/03/19 120/70  07/10/19 136/70   Wt Readings from Last 3 Encounters:  02/03/20 140 lb 6.4 oz (63.7 kg)  08/03/19 136 lb 12.8 oz (62.1 kg)  07/10/19 137 lb 12.8 oz (62.5 kg)    Physical Exam Constitutional:      General: She is not in acute distress.    Appearance: She is not diaphoretic.  Cardiovascular:     Rate and Rhythm: Normal rate and regular rhythm.     Heart sounds: Normal heart sounds.  Pulmonary:     Effort: Pulmonary effort is normal.     Breath sounds: Normal breath sounds.  Musculoskeletal:     Right lower leg: No edema.     Left lower leg: No edema.  Skin:    General: Skin is warm and dry.  Neurological:     Mental Status: She is alert.      Assessment/Plan: Please see individual problem list.  Problem List Items Addressed This Visit    Hyperlipidemia    Check lipid panel.  Continue simvastatin 40 mg once daily.      Relevant Orders   Lipid panel   Hypertension    Well-controlled at home.  Continue lisinopril-HCTZ 20-25 mg once daily.  Labs today.      Relevant Orders   Comp Met (CMET)   Hypothyroidism    Check  TSH.  Continue levothyroxine 100 mcg once daily.      Relevant Orders   TSH   Sleeping difficulty    Stable.  Continue Ambien 5 mg once daily.          Health Maintenance: The patient will call to schedule her mammogram.   This visit occurred during the SARS-CoV-2 public health emergency.  Safety protocols were in place, including screening questions prior to the visit, additional usage of staff PPE, and extensive cleaning of exam room while observing appropriate contact time as indicated for disinfecting solutions.    [name], MD [place]

## 2020-02-03 NOTE — Patient Instructions (Signed)
Nice to see you. We will get lab work today and contact you with the results. Please call to schedule your mammogram. Please let me know when you need a refill.

## 2020-02-04 LAB — COMPREHENSIVE METABOLIC PANEL
ALT: 18 U/L (ref 0–35)
AST: 21 U/L (ref 0–37)
Albumin: 4.4 g/dL (ref 3.5–5.2)
Alkaline Phosphatase: 70 U/L (ref 39–117)
BUN: 20 mg/dL (ref 6–23)
CO2: 29 mEq/L (ref 19–32)
Calcium: 9.4 mg/dL (ref 8.4–10.5)
Chloride: 101 mEq/L (ref 96–112)
Creatinine, Ser: 1.14 mg/dL (ref 0.40–1.20)
GFR: 47.86 mL/min — ABNORMAL LOW (ref >60.00)
Glucose, Bld: 79 mg/dL (ref 70–99)
Potassium: 3.7 mEq/L (ref 3.5–5.1)
Sodium: 140 mEq/L (ref 135–145)
Total Bilirubin: 0.8 mg/dL (ref 0.2–1.2)
Total Protein: 6.6 g/dL (ref 6.0–8.3)

## 2020-02-04 LAB — LIPID PANEL
Cholesterol: 196 mg/dL (ref 0–200)
HDL: 47.2 mg/dL (ref >39.00)
LDL Cholesterol: 109 mg/dL — ABNORMAL HIGH (ref 0–99)
NonHDL: 148.89
Total CHOL/HDL Ratio: 4
Triglycerides: 198 mg/dL — ABNORMAL HIGH (ref 0.0–149.0)
VLDL: 39.6 mg/dL (ref 0.0–40.0)

## 2020-02-04 LAB — TSH: TSH: 2.34 u[IU]/mL (ref 0.35–4.50)

## 2020-02-08 ENCOUNTER — Other Ambulatory Visit: Payer: Self-pay | Admitting: Family Medicine

## 2020-02-10 ENCOUNTER — Other Ambulatory Visit: Payer: Self-pay | Admitting: Family Medicine

## 2020-02-10 ENCOUNTER — Telehealth: Payer: Self-pay | Admitting: Family Medicine

## 2020-02-10 DIAGNOSIS — E785 Hyperlipidemia, unspecified: Secondary | ICD-10-CM

## 2020-02-10 MED ORDER — ROSUVASTATIN CALCIUM 40 MG PO TABS: 40.0000 mg | ORAL_TABLET | Freq: Every day | ORAL | 3 refills | Status: DC

## 2020-02-10 NOTE — Telephone Encounter (Signed)
Patient is returning Nina's phone call about a Medicare letter that the patient brought into the office for Dr. Caryl Bis to review.

## 2020-02-10 NOTE — Telephone Encounter (Signed)
Noted. Tammy Dalton is no longer in our office, so this should not be an issue.

## 2020-02-10 NOTE — Telephone Encounter (Signed)
Patient stated that she had a discussion with the PCP about a time that she saw L. Guse and the insurance company informed her not to see her because they would not pay.  She stated she informed the PCP and he wanted to see the letter.  It is in the review basket for you, patent does not want it back it is just a FYI.  Arabel Barcenas,cma

## 2020-02-11 ENCOUNTER — Telehealth: Payer: Self-pay

## 2020-02-11 NOTE — Telephone Encounter (Signed)
LMTCB to schedule non-fasting labs. 

## 2020-02-29 DIAGNOSIS — Z1231 Encounter for screening mammogram for malignant neoplasm of breast: Secondary | ICD-10-CM | POA: Diagnosis not present

## 2020-02-29 LAB — HM MAMMOGRAPHY

## 2020-03-09 DIAGNOSIS — Z03818 Encounter for observation for suspected exposure to other biological agents ruled out: Secondary | ICD-10-CM | POA: Diagnosis not present

## 2020-03-21 ENCOUNTER — Other Ambulatory Visit: Payer: Self-pay | Admitting: Family Medicine

## 2020-03-30 ENCOUNTER — Other Ambulatory Visit (INDEPENDENT_AMBULATORY_CARE_PROVIDER_SITE_OTHER): Payer: Medicare Other

## 2020-03-30 ENCOUNTER — Other Ambulatory Visit: Payer: Self-pay

## 2020-03-30 DIAGNOSIS — E785 Hyperlipidemia, unspecified: Secondary | ICD-10-CM

## 2020-03-30 LAB — LDL CHOLESTEROL, DIRECT: Direct LDL: 41 mg/dL

## 2020-03-30 LAB — HEPATIC FUNCTION PANEL
ALT: 33 U/L (ref 0–35)
AST: 33 U/L (ref 0–37)
Albumin: 4.5 g/dL (ref 3.5–5.2)
Alkaline Phosphatase: 75 U/L (ref 39–117)
Bilirubin, Direct: 0.1 mg/dL (ref 0.0–0.3)
Total Bilirubin: 0.6 mg/dL (ref 0.2–1.2)
Total Protein: 6.9 g/dL (ref 6.0–8.3)

## 2020-04-04 ENCOUNTER — Ambulatory Visit: Payer: Medicare Other

## 2020-04-05 DIAGNOSIS — D485 Neoplasm of uncertain behavior of skin: Secondary | ICD-10-CM | POA: Diagnosis not present

## 2020-04-05 DIAGNOSIS — X32XXXA Exposure to sunlight, initial encounter: Secondary | ICD-10-CM | POA: Diagnosis not present

## 2020-04-05 DIAGNOSIS — L570 Actinic keratosis: Secondary | ICD-10-CM | POA: Diagnosis not present

## 2020-04-05 DIAGNOSIS — Z85828 Personal history of other malignant neoplasm of skin: Secondary | ICD-10-CM | POA: Diagnosis not present

## 2020-04-05 DIAGNOSIS — D2261 Melanocytic nevi of right upper limb, including shoulder: Secondary | ICD-10-CM | POA: Diagnosis not present

## 2020-04-05 DIAGNOSIS — C44629 Squamous cell carcinoma of skin of left upper limb, including shoulder: Secondary | ICD-10-CM | POA: Diagnosis not present

## 2020-04-05 DIAGNOSIS — D2262 Melanocytic nevi of left upper limb, including shoulder: Secondary | ICD-10-CM | POA: Diagnosis not present

## 2020-04-05 DIAGNOSIS — D2271 Melanocytic nevi of right lower limb, including hip: Secondary | ICD-10-CM | POA: Diagnosis not present

## 2020-04-05 DIAGNOSIS — C44319 Basal cell carcinoma of skin of other parts of face: Secondary | ICD-10-CM | POA: Diagnosis not present

## 2020-04-05 DIAGNOSIS — D225 Melanocytic nevi of trunk: Secondary | ICD-10-CM | POA: Diagnosis not present

## 2020-04-07 ENCOUNTER — Ambulatory Visit (INDEPENDENT_AMBULATORY_CARE_PROVIDER_SITE_OTHER): Payer: Medicare Other

## 2020-04-07 VITALS — Ht 62.0 in | Wt 140.0 lb

## 2020-04-07 DIAGNOSIS — Z0000 Encounter for general adult medical examination without abnormal findings: Secondary | ICD-10-CM | POA: Diagnosis not present

## 2020-04-07 NOTE — Progress Notes (Signed)
Subjective:   Tammy Dalton is a 75 y.o. female who presents for Medicare Annual (Subsequent) preventive examination.  Review of Systems    No ROS.  Medicare Wellness Virtual Visit.     Cardiac Risk Factors include: advanced age (>66men, >62 women);hypertension     Objective:    Today's Vitals   04/07/20 1135  Weight: 140 lb (63.5 kg)  Height: 5\' 2"  (1.575 m)   Body mass index is 25.61 kg/m.  Advanced Directives 04/07/2020 04/02/2019 01/13/2018 04/13/2017 12/24/2016 04/24/2016  Does Patient Have a Medical Advance Directive? Yes Yes Yes No Yes Yes  Type of Paramedic of Tammy Dalton;Living will [place];Living will [place];Living will - [employer];Living will [place];Living will  Does patient want to make changes to medical advance directive? No - Patient declined No - Patient declined No - Patient declined - No - Patient declined -  Copy of Tammy Dalton in Chart? No - copy requested No - copy requested No - copy requested - No - copy requested No - copy requested  Would patient like information on creating a medical advance directive? - - - No - Patient declined - -    Current Medications (verified) Outpatient Encounter Medications as of 04/07/2020  Medication Sig  . aspirin EC 81 MG tablet Take 81 mg by mouth daily. Taking every other day.  . cholecalciferol (VITAMIN D) 1000 units tablet Take 1,000 Units by mouth daily.  . EUTHYROX 100 MCG tablet TAKE 1 TABLET BY MOUTH ONCE DAILY BEFORE BREAKFAST  . ferrous sulfate 325 (65 FE) MG tablet Take 325 mg by mouth daily with breakfast.  . lisinopril-hydrochlorothiazide (ZESTORETIC) 20-25 MG tablet Take 1 tablet by mouth once daily  . Omega-3 Fatty Acids (FISH OIL) 1200 MG CAPS Take by mouth.  . rosuvastatin (CRESTOR) 40 MG tablet Take 1 tablet (40 mg total) by mouth daily.  . simvastatin (ZOCOR) 40 MG tablet TAKE 1 TABLET  BY MOUTH ONCE DAILY AT  6PM  . zolpidem (AMBIEN) 5 MG tablet Take 1 tablet (5 mg total) by mouth at bedtime as needed. for sleep  . [DISCONTINUED] cephALEXin (KEFLEX) 500 MG capsule Take 1 capsule (500 mg total) by mouth every 6 (six) hours. (Patient not taking: Reported on 02/03/2020)   No facility-administered encounter medications on file as of 04/07/2020.    Allergies (verified) Codeine   History: Past Medical History:  Diagnosis Date  . Hyperlipidemia   . Hypertension   . Thyroid disease    Past Surgical History:  Procedure Laterality Date  . anal sphincterotomy and fissurectomy  2009  . COLONOSCOPY  05/24/2011   Dr. Jill Side done at [facility]  . COLONOSCOPY  05/20/2000   Dr. Jamal Collin; Sharp bend at 50 cm. Scope could not be advanced any further even with glucagon.  . COLONOSCOPY WITH PROPOFOL N/A 04/24/2016   Procedure: COLONOSCOPY WITH PROPOFOL;  Surgeon: Christene Lye, MD;  Location: [facility] ENDOSCOPY;  Service: Endoscopy;  Laterality: N/A;  . EYE SURGERY Bilateral 2010   cataract surgery.   . TONSILLECTOMY     Family History  Problem Relation Age of Onset  . Depression Mother   . Heart disease Father   . Early death Sister   . Alcohol abuse Brother   . Stroke Paternal Grandfather    Social History   Socioeconomic History  . Marital status: Single    Spouse name: Not on  file  . Number of children: Not on file  . Years of education: Not on file  . Highest education level: Not on file  Occupational History  . Not on file  Tobacco Use  . Smoking status: Former Smoker    Types: Cigarettes  . Smokeless tobacco: Never Used  Vaping Use  . Vaping Use: Never used  Substance and Sexual Activity  . Alcohol use: No  . Drug use: No  . Sexual activity: Never  Other Topics Concern  . Not on file  Social History Narrative  . Not on file   Social Determinants of Health   Financial Resource Strain: Low Risk   . Difficulty of  Paying Living Expenses: Not hard at all  Food Insecurity: No Food Insecurity  . Worried About Programme researcher, broadcasting/film/video in the Last Year: Never true  . Ran Out of Food in the Last Year: Never true  Transportation Needs: No Transportation Needs  . Lack of Transportation (Medical): No  . Lack of Transportation (Non-Medical): No  Physical Activity: Not on file  Stress: No Stress Concern Present  . Feeling of Stress : Not at all  Social Connections: Unknown  . Frequency of Communication with Friends and Family: More than three times a week  . Frequency of Social Gatherings with Friends and Family: More than three times a week  . Attends Religious Services: Not on file  . Active Member of Clubs or Organizations: Not on file  . Attends Banker Meetings: Not on file  . Marital Status: Not on file    Tobacco Counseling Counseling given: Not Answered   Clinical Intake:  Pre-visit preparation completed: Yes        Diabetes: No  How often do you need to have someone help you when you read instructions, pamphlets, or other written materials from your doctor or pharmacy?: 1 - Never   Interpreter Needed?: No      Activities of Daily Living In your present state of health, do you have any difficulty performing the following activities: 04/07/2020  Hearing? N  Vision? N  Difficulty concentrating or making decisions? N  Walking or climbing stairs? N  Dressing or bathing? N  Doing errands, shopping? N  Preparing Food and eating ? N  Using the Toilet? N  In the past six months, have you accidently leaked urine? N  Do you have problems with loss of bowel control? N  Managing your Medications? N  Managing your Finances? N  Housekeeping or managing your Housekeeping? N  Some recent data might be hidden    Patient Care Team: Glori Luis, MD as PCP - General (Family Medicine) Kieth Brightly, MD (General Surgery)  Indicate any recent Medical Services you may  have received from other than [facility] providers in the past year (date may be approximate).     Assessment:   This is a routine wellness examination for Tammy Dalton.  I connected with Tammy Dalton today by telephone and verified that I am speaking with the correct person using two identifiers. Location patient: home Location provider: work Persons participating in the virtual visit: patient, Engineer, civil (consulting).    I discussed the limitations, risks, security and privacy concerns of performing an evaluation and management service by telephone and the availability of in person appointments. The patient expressed understanding and verbally consented to this telephonic visit.    Interactive audio and video telecommunications were attempted between this provider and patient, however failed, due to patient having technical  difficulties OR patient did not have access to video capability.  We continued and completed visit with audio only.  Some vital signs may be absent or patient reported.   Hearing/Vision screen  Hearing Screening   125Hz  250Hz  500Hz  1000Hz  2000Hz  3000Hz  4000Hz  6000Hz  8000Hz   Right ear:           Left ear:           Comments: Patient is able to hear conversational tones without difficulty.  No issues reported.  Vision Screening Comments: [facility] (Dr. )  Wears lenses when reading  Cataract extraction, bilateral  Visual acuity not assessed per patient preference since they have regular follow up with the ophthalmologist  Dietary issues and exercise activities discussed: Current Exercise Habits: Home exercise routine, Intensity: Mild  Goals      Patient Stated   .  Follow up with Primary Care Provider (pt-stated)      As needed      Depression Screen PHQ 2/9 Scores 04/07/2020 02/03/2020 08/03/2019 04/02/2019 12/16/2018 01/13/2018 12/24/2016  PHQ - 2 Score 0 0 0 0 0 0 0    Fall Risk Fall Risk  04/07/2020 02/03/2020 08/03/2019 04/02/2019 12/16/2018  Falls in the past year? 0 0 0  0 0  Number falls in past yr: 0 0 0 - 0  Injury with Fall? 0 - - - -  Comment - - - - -  Follow up Falls evaluation completed Falls evaluation completed Falls evaluation completed Falls prevention discussed Falls evaluation completed    FALL RISK PREVENTION PERTAINING TO THE HOME: Handrails in use when in use? Yes Home free of loose throw rugs in walkways, pet beds, electrical cords, etc? Yes  Adequate lighting in your home to reduce risk of falls? Yes   ASSISTIVE DEVICES UTILIZED TO PREVENT FALLS: Use of a cane, walker or w/c? No   TIMED UP AND GO: Was the test performed? No . Virtual visit.   Cognitive Function: Patient is alert and oriented x3.  Denies difficulty focusing, making decisions, memory loss.  Currently working 3 days weekly.  MMSE/6CIT deferred. Normal by direct communication/observation.  MMSE - Mini Mental State Exam 01/13/2018 12/24/2016  Orientation to time 5 5  Orientation to Place 5 5  Registration 3 3  Attention/ Calculation 5 5  Recall 3 3  Language- name 2 objects 2 2  Language- repeat 1 1  Language- follow 3 step command 3 3  Language- read & follow direction 1 1  Write a sentence 1 1  Copy design 1 1  Total score 30 30        Immunizations Immunization History  Administered Date(s) Administered  . Influenza, High Dose Seasonal PF 12/24/2016, 12/02/2017, 12/15/2018  . Influenza-Unspecified 11/25/2015, 12/02/2017, 12/18/2019  . PFIZER SARS-COV-2 Vaccination 05/01/2019, 05/22/2019, 01/07/2020  . Pneumococcal Conjugate-13 06/28/2017  . Pneumococcal Polysaccharide-23 06/30/2018  . Pneumococcal-Unspecified 05/25/2011  . Tdap 08/01/2015, 04/13/2017  . Zoster 03/27/2007  . Zoster Recombinat (Shingrix) 06/13/2017, 09/13/2017   Health Maintenance There are no preventive care reminders to display for this patient. Health Maintenance  Topic Date Due  . COVID-19 Vaccine (4 - Booster for Pfizer series) 07/07/2020  . MAMMOGRAM  02/28/2022  .  COLONOSCOPY (Pts 45-62yrs Insurance coverage will need to be confirmed)  04/24/2026  . TETANUS/TDAP  04/14/2027  . INFLUENZA VACCINE  Completed  . DEXA SCAN  Completed  . Hepatitis C Screening  Completed  . PNA vac Low Risk Adult  Completed   Colorectal  cancer screening: Type of screening: Colonoscopy. Completed 04/24/16. Repeat every 10 years.   Mammogram status: Completed 02/29/20. Repeat every year  Bone Density status: Completed 01/15/17. Results reflect: Bone density results: OSTEOPENIA. Repeat every 2-5 years. cholecalciferol (VITAMIN D) 1000 units tablet.   Lung Cancer Screening: (Low Dose CT Chest recommended if Age 28-80 years, 30 pack-year currently smoking OR have quit w/in 15years.) does not qualify.   Hepatitis C Screening: Completed 07/10/16.   Vision Screening: Recommended annual ophthalmology exams for early detection of glaucoma and other disorders of the eye. Is the patient up to date with their annual eye exam?  Yes  Who is the provider or what is the name of the office in which the patient attends annual eye exams? [facility]  Dental Screening: Recommended annual dental exams for proper oral hygiene.  Community Resource Referral / Chronic Care Management: CRR required this visit?  No   CCM required this visit?  No      Plan:   Keep all routine maintenance appointments.   Follow up 08/10/20 @ 9:30  I have personally reviewed and noted the following in the patient's chart:   . Medical and social history . Use of alcohol, tobacco or illicit drugs  . Current medications and supplements . Functional ability and status . Nutritional status . Physical activity . Advanced directives . List of other physicians . Hospitalizations, surgeries, and ER visits in previous 12 months . Vitals . Screenings to include cognitive, depression, and falls . Referrals and appointments  In addition, I have reviewed and discussed with patient certain preventive  protocols, quality metrics, and best practice recommendations. A written personalized care plan for preventive services as well as general preventive health recommendations were provided to patient via mychart.     Varney Biles, LPN   [id number]/61/0960

## 2020-04-07 NOTE — Patient Instructions (Addendum)
Tammy Dalton , Thank you for taking time to come for your Medicare Wellness Visit. I appreciate your ongoing commitment to your health goals. Please review the following plan we discussed and let me know if I can assist you in the future.   These are the goals we discussed: Goals      Patient Stated   .  Follow up with Primary Care Provider (pt-stated)      As needed       This is a list of the screening recommended for you and due dates:  Health Maintenance  Topic Date Due  . COVID-19 Vaccine (4 - Booster for Pfizer series) 07/07/2020  . Mammogram  02/28/2022  . Colon Cancer Screening  04/24/2026  . Tetanus Vaccine  04/14/2027  . Flu Shot  Completed  . DEXA scan (bone density measurement)  Completed  .  Hepatitis C: One time screening is recommended by Center for Disease Control  (CDC) for  adults born from [birth year] through 1964.   Completed  . Pneumonia vaccines  Completed   Immunizations Immunization History  Administered Date(s) Administered  . Influenza, High Dose Seasonal PF 12/24/2016, 12/02/2017, 12/15/2018  . Influenza-Unspecified 11/25/2015, 12/02/2017, 12/18/2019  . PFIZER SARS-COV-2 Vaccination 05/01/2019, 05/22/2019, 01/07/2020  . Pneumococcal Conjugate-13 06/28/2017  . Pneumococcal Polysaccharide-23 06/30/2018  . Pneumococcal-Unspecified 05/25/2011  . Tdap 08/01/2015, 04/13/2017  . Zoster 03/27/2007  . Zoster Recombinat (Shingrix) 06/13/2017, 09/13/2017   Keep all routine maintenance appointments.   Follow up 08/10/20 @ 9:30  Advanced directives: End of life planning; Advance aging; Advanced directives discussed.  Copy of current HCPOA/Living Will requested.    Conditions/risks identified: none new.   Follow up in one year for your annual wellness visit.   Preventive Care 6 Years and Older, Female Preventive care refers to lifestyle choices and visits with your health care provider that can promote health and wellness. What does preventive care include?  A  yearly physical exam. This is also called an annual well check.  Dental exams once or twice a year.  Routine eye exams. Ask your health care provider how often you should have your eyes checked.  Personal lifestyle choices, including:  Daily care of your teeth and gums.  Regular physical activity.  Eating a healthy diet.  Avoiding tobacco and drug use.  Limiting alcohol use.  Practicing safe sex.  Taking low-dose aspirin every day.  Taking vitamin and mineral supplements as recommended by your health care provider. What happens during an annual well check? The services and screenings done by your health care provider during your annual well check will depend on your age, overall health, lifestyle risk factors, and family history of disease. Counseling  Your health care provider may ask you questions about your:  Alcohol use.  Tobacco use.  Drug use.  Emotional well-being.  Home and relationship well-being.  Sexual activity.  Eating habits.  History of falls.  Memory and ability to understand (cognition).  Work and work Statistician.  Reproductive health. Screening  You may have the following tests or measurements:  Height, weight, and BMI.  Blood pressure.  Lipid and cholesterol levels. These may be checked every 5 years, or more frequently if you are over 74 years old.  Skin check.  Lung cancer screening. You may have this screening every year starting at age 49 if you have a 30-pack-year history of smoking and currently smoke or have quit within the past 15 years.  Fecal occult blood test (FOBT) of the stool.  You may have this test every year starting at age 65.  Flexible sigmoidoscopy or colonoscopy. You may have a sigmoidoscopy every 5 years or a colonoscopy every 10 years starting at age 58.  Hepatitis C blood test.  Hepatitis B blood test.  Sexually transmitted disease (STD) testing.  Diabetes screening. This is done by checking your blood  sugar (glucose) after you have not eaten for a while (fasting). You may have this done every 1-3 years.  Bone density scan. This is done to screen for osteoporosis. You may have this done starting at age 48.  Mammogram. This may be done every 1-2 years. Talk to your health care provider about how often you should have regular mammograms. Talk with your health care provider about your test results, treatment options, and if necessary, the need for more tests. Vaccines  Your health care provider may recommend certain vaccines, such as:  Influenza vaccine. This is recommended every year.  Tetanus, diphtheria, and acellular pertussis (Tdap, Td) vaccine. You may need a Td booster every 10 years.  Zoster vaccine. You may need this after age 82.  Pneumococcal 13-valent conjugate (PCV13) vaccine. One dose is recommended after age 47.  Pneumococcal polysaccharide (PPSV23) vaccine. One dose is recommended after age 70. Talk to your health care provider about which screenings and vaccines you need and how often you need them. This information is not intended to replace advice given to you by your health care provider. Make sure you discuss any questions you have with your health care provider. Document Released: 04/08/2015 Document Revised: 11/30/2015 Document Reviewed: 01/11/2015 Elsevier Interactive Patient Education  2017 [place] Prevention in the Home Falls can cause injuries. They can happen to people of all ages. There are many things you can do to make your home safe and to help prevent falls. What can I do on the outside of my home?  Regularly fix the edges of walkways and driveways and fix any cracks.  Remove anything that might make you trip as you walk through a door, such as a raised step or threshold.  Trim any bushes or trees on the path to your home.  Use bright outdoor lighting.  Clear any walking paths of anything that might make someone trip, such as rocks or  tools.  Regularly check to see if handrails are loose or broken. Make sure that both sides of any steps have handrails.  Any raised decks and porches should have guardrails on the edges.  Have any leaves, snow, or ice cleared regularly.  Use sand or salt on walking paths during winter.  Clean up any spills in your garage right away. This includes oil or grease spills. What can I do in the bathroom?  Use night lights.  Install grab bars by the toilet and in the tub and shower. Do not use towel bars as grab bars.  Use non-skid mats or decals in the tub or shower.  If you need to sit down in the shower, use a plastic, non-slip stool.  Keep the floor dry. Clean up any water that spills on the floor as soon as it happens.  Remove soap buildup in the tub or shower regularly.  Attach bath mats securely with double-sided non-slip rug tape.  Do not have throw rugs and other things on the floor that can make you trip. What can I do in the bedroom?  Use night lights.  Make sure that you have a light by your bed that  is easy to reach.  Do not use any sheets or blankets that are too big for your bed. They should not hang down onto the floor.  Have a firm chair that has side arms. You can use this for support while you get dressed.  Do not have throw rugs and other things on the floor that can make you trip. What can I do in the kitchen?  Clean up any spills right away.  Avoid walking on wet floors.  Keep items that you use a lot in easy-to-reach places.  If you need to reach something above you, use a strong step stool that has a grab bar.  Keep electrical cords out of the way.  Do not use floor polish or wax that makes floors slippery. If you must use wax, use non-skid floor wax.  Do not have throw rugs and other things on the floor that can make you trip. What can I do with my stairs?  Do not leave any items on the stairs.  Make sure that there are handrails on both  sides of the stairs and use them. Fix handrails that are broken or loose. Make sure that handrails are as long as the stairways.  Check any carpeting to make sure that it is firmly attached to the stairs. Fix any carpet that is loose or worn.  Avoid having throw rugs at the top or bottom of the stairs. If you do have throw rugs, attach them to the floor with carpet tape.  Make sure that you have a light switch at the top of the stairs and the bottom of the stairs. If you do not have them, ask someone to add them for you. What else can I do to help prevent falls?  Wear shoes that:  Do not have high heels.  Have rubber bottoms.  Are comfortable and fit you well.  Are closed at the toe. Do not wear sandals.  If you use a stepladder:  Make sure that it is fully opened. Do not climb a closed stepladder.  Make sure that both sides of the stepladder are locked into place.  Ask someone to hold it for you, if possible.  Clearly mark and make sure that you can see:  Any grab bars or handrails.  First and last steps.  Where the edge of each step is.  Use tools that help you move around (mobility aids) if they are needed. These include:  Canes.  Walkers.  Scooters.  Crutches.  Turn on the lights when you go into a dark area. Replace any light bulbs as soon as they burn out.  Set up your furniture so you have a clear path. Avoid moving your furniture around.  If any of your floors are uneven, fix them.  If there are any pets around you, be aware of where they are.  Review your medicines with your doctor. Some medicines can make you feel dizzy. This can increase your chance of falling. Ask your doctor what other things that you can do to help prevent falls. This information is not intended to replace advice given to you by your health care provider. Make sure you discuss any questions you have with your health care provider. Document Released: 01/06/2009 Document Revised:  08/18/2015 Document Reviewed: 04/16/2014 Elsevier Interactive Patient Education  2017 [employer].

## 2020-05-04 DIAGNOSIS — H16223 Keratoconjunctivitis sicca, not specified as Sjogren's, bilateral: Secondary | ICD-10-CM | POA: Diagnosis not present

## 2020-05-05 DIAGNOSIS — C44629 Squamous cell carcinoma of skin of left upper limb, including shoulder: Secondary | ICD-10-CM | POA: Diagnosis not present

## 2020-05-12 DIAGNOSIS — C44319 Basal cell carcinoma of skin of other parts of face: Secondary | ICD-10-CM | POA: Diagnosis not present

## 2020-05-16 ENCOUNTER — Other Ambulatory Visit: Payer: Self-pay | Admitting: Family Medicine

## 2020-07-04 ENCOUNTER — Other Ambulatory Visit: Payer: Self-pay | Admitting: Family Medicine

## 2020-08-03 ENCOUNTER — Other Ambulatory Visit: Payer: Self-pay | Admitting: Family Medicine

## 2020-08-04 ENCOUNTER — Other Ambulatory Visit: Payer: Self-pay

## 2020-08-08 ENCOUNTER — Other Ambulatory Visit: Payer: Self-pay | Admitting: Family Medicine

## 2020-08-10 ENCOUNTER — Encounter: Payer: Self-pay | Admitting: Family Medicine

## 2020-08-10 ENCOUNTER — Ambulatory Visit (INDEPENDENT_AMBULATORY_CARE_PROVIDER_SITE_OTHER): Payer: Medicare Other | Admitting: Family Medicine

## 2020-08-10 ENCOUNTER — Other Ambulatory Visit: Payer: Self-pay

## 2020-08-10 DIAGNOSIS — M8580 Other specified disorders of bone density and structure, unspecified site: Secondary | ICD-10-CM

## 2020-08-10 DIAGNOSIS — G479 Sleep disorder, unspecified: Secondary | ICD-10-CM | POA: Diagnosis not present

## 2020-08-10 DIAGNOSIS — I10 Essential (primary) hypertension: Secondary | ICD-10-CM

## 2020-08-10 NOTE — Assessment & Plan Note (Signed)
Well-controlled.  Check labs.  Continue lisinopril-HCTZ 1 tablet once daily.

## 2020-08-10 NOTE — Patient Instructions (Signed)
Nice to see you. Please call [phone] for your bone density scan. Your goal calcium intake per day is 1200 mg.  Ideally this will come from your diet though if you are not getting close to that you should add a calcium supplement.

## 2020-08-10 NOTE — Assessment & Plan Note (Signed)
Stable.  She will continue Ambien 5 mg once daily.

## 2020-08-10 NOTE — Progress Notes (Signed)
[name], MD Phone: [phone]  Tammy Dalton is a 75 y.o. female who presents today for f/u.  HYPERTENSION  Disease Monitoring  Home BP Monitoring not checking Chest pain- no    Dyspnea- no Medications  Compliance-  Taking lisinopril/HCTZ.   Edema- no  Insomnia: This is stable.  She takes Ambien nightly.  She gets 6 hours of sleep.  There is no drowsiness associated with this medication.  No alcohol intake.  Osteopenia: She takes vitamin D.    Social History   Tobacco Use  Smoking Status Former Smoker  . Types: Cigarettes  Smokeless Tobacco Never Used    Current Outpatient Medications on File Prior to Visit  Medication Sig Dispense Refill  . aspirin EC 81 MG tablet Take 81 mg by mouth daily. Taking every other day.    . cholecalciferol (VITAMIN D) 1000 units tablet Take 1,000 Units by mouth daily.    . EUTHYROX 100 MCG tablet TAKE 1 TABLET BY MOUTH ONCE DAILY BEFORE BREAKFAST 90 tablet 0  . ferrous sulfate 325 (65 FE) MG tablet Take 325 mg by mouth daily with breakfast.    . lisinopril-hydrochlorothiazide (ZESTORETIC) 20-25 MG tablet Take 1 tablet by mouth once daily 90 tablet 0  . rosuvastatin (CRESTOR) 40 MG tablet Take 1 tablet (40 mg total) by mouth daily. 90 tablet 3  . zolpidem (AMBIEN) 5 MG tablet TAKE 1 TABLET BY MOUTH AT BEDTIME AS NEEDED FOR SLEEP 90 tablet 0  . Omega-3 Fatty Acids (FISH OIL) 1200 MG CAPS Take by mouth. (Patient not taking: Reported on 08/10/2020)    . simvastatin (ZOCOR) 40 MG tablet TAKE 1 TABLET BY MOUTH ONCE DAILY AT  6PM (Patient not taking: Reported on 08/10/2020) 90 tablet 0   No current facility-administered medications on file prior to visit.     ROS see history of present illness  Objective  Physical Exam Vitals:   08/10/20 1545  BP: 120/70  Pulse: 89  Temp: 98.5 F (36.9 C)  SpO2: 97%    BP Readings from Last 3 Encounters:  08/10/20 120/70  02/03/20 120/80  08/03/19 120/70   Wt Readings from Last 3  Encounters:  08/10/20 141 lb 1.9 oz (64 kg)  04/07/20 140 lb (63.5 kg)  02/03/20 140 lb 6.4 oz (63.7 kg)    Physical Exam Constitutional:      General: She is not in acute distress.    Appearance: She is not diaphoretic.  Cardiovascular:     Rate and Rhythm: Normal rate and regular rhythm.     Heart sounds: Normal heart sounds.  Pulmonary:     Effort: Pulmonary effort is normal.     Breath sounds: Normal breath sounds.  Musculoskeletal:     Right lower leg: No edema.     Left lower leg: No edema.  Skin:    General: Skin is warm and dry.  Neurological:     Mental Status: She is alert.      Assessment/Plan: Please see individual problem list.  Problem List Items Addressed This Visit    Hypertension    Well-controlled.  Check labs.  Continue lisinopril-HCTZ 1 tablet once daily.      Relevant Orders   Basic Metabolic Panel (BMET)   Sleeping difficulty    Stable.  She will continue Ambien 5 mg once daily.      Osteopenia    Due for bone density scan.  This will be ordered and the patient will call to schedule.  She will  continue vitamin D supplementation.  Discussed goal calcium intake of 1200 mg/day.  If needed she can add a supplement to bridge the gap with her dietary intake.  Check vitamin D.      Relevant Orders   Vitamin D (25 hydroxy)   DG Bone Density       Health Maintenance: I encouraged her to get the second covid booster vaccine.   Return in about 6 months (around 02/10/2021) for Hypertension.  This visit occurred during the SARS-CoV-2 public health emergency.  Safety protocols were in place, including screening questions prior to the visit, additional usage of staff PPE, and extensive cleaning of exam room while observing appropriate contact time as indicated for disinfecting solutions.    [name], MD [place]

## 2020-08-10 NOTE — Assessment & Plan Note (Signed)
Due for bone density scan.  This will be ordered and the patient will call to schedule.  She will continue vitamin D supplementation.  Discussed goal calcium intake of 1200 mg/day.  If needed she can add a supplement to bridge the gap with her dietary intake.  Check vitamin D.

## 2020-08-11 LAB — BASIC METABOLIC PANEL
BUN: 23 mg/dL (ref 6–23)
CO2: 28 mEq/L (ref 19–32)
Calcium: 9.4 mg/dL (ref 8.4–10.5)
Chloride: 102 mEq/L (ref 96–112)
Creatinine, Ser: 1.13 mg/dL (ref 0.40–1.20)
GFR: 48.19 mL/min — ABNORMAL LOW (ref >60.00)
Glucose, Bld: 84 mg/dL (ref 70–99)
Potassium: 3.7 mEq/L (ref 3.5–5.1)
Sodium: 140 mEq/L (ref 135–145)

## 2020-08-11 LAB — VITAMIN D 25 HYDROXY (VIT D DEFICIENCY, FRACTURES): VITD: 47.98 ng/mL (ref 30.00–100.00)

## 2020-08-30 DIAGNOSIS — Z780 Asymptomatic menopausal state: Secondary | ICD-10-CM | POA: Diagnosis not present

## 2020-08-30 DIAGNOSIS — M85852 Other specified disorders of bone density and structure, left thigh: Secondary | ICD-10-CM | POA: Diagnosis not present

## 2020-08-30 LAB — HM DEXA SCAN

## 2020-09-06 ENCOUNTER — Ambulatory Visit: Payer: Medicare Other | Admitting: General Surgery

## 2020-09-13 DIAGNOSIS — D2261 Melanocytic nevi of right upper limb, including shoulder: Secondary | ICD-10-CM | POA: Diagnosis not present

## 2020-09-13 DIAGNOSIS — L538 Other specified erythematous conditions: Secondary | ICD-10-CM | POA: Diagnosis not present

## 2020-09-13 DIAGNOSIS — L820 Inflamed seborrheic keratosis: Secondary | ICD-10-CM | POA: Diagnosis not present

## 2020-09-13 DIAGNOSIS — L821 Other seborrheic keratosis: Secondary | ICD-10-CM | POA: Diagnosis not present

## 2020-09-13 DIAGNOSIS — D2271 Melanocytic nevi of right lower limb, including hip: Secondary | ICD-10-CM | POA: Diagnosis not present

## 2020-09-13 DIAGNOSIS — L298 Other pruritus: Secondary | ICD-10-CM | POA: Diagnosis not present

## 2020-09-13 DIAGNOSIS — D2262 Melanocytic nevi of left upper limb, including shoulder: Secondary | ICD-10-CM | POA: Diagnosis not present

## 2020-09-13 DIAGNOSIS — L570 Actinic keratosis: Secondary | ICD-10-CM | POA: Diagnosis not present

## 2020-09-13 DIAGNOSIS — Z85828 Personal history of other malignant neoplasm of skin: Secondary | ICD-10-CM | POA: Diagnosis not present

## 2020-09-13 DIAGNOSIS — X32XXXA Exposure to sunlight, initial encounter: Secondary | ICD-10-CM | POA: Diagnosis not present

## 2020-09-16 ENCOUNTER — Telehealth: Payer: Self-pay | Admitting: Family Medicine

## 2020-09-16 NOTE — Telephone Encounter (Signed)
PT called to get their results for the bone density that was done.

## 2020-09-22 NOTE — Telephone Encounter (Signed)
Please let the patient know her bone density was read as normal.  Thanks.

## 2020-09-23 NOTE — Telephone Encounter (Signed)
Patient was informed of results.  Patient understood and no questions, comments, or concerns at this time.  

## 2020-09-29 ENCOUNTER — Other Ambulatory Visit: Payer: Self-pay | Admitting: Internal Medicine

## 2020-09-29 ENCOUNTER — Telehealth: Payer: Self-pay | Admitting: Family Medicine

## 2020-09-29 DIAGNOSIS — G4700 Insomnia, unspecified: Secondary | ICD-10-CM

## 2020-09-29 MED ORDER — ZOLPIDEM TARTRATE 5 MG PO TABS: 5.0000 mg | ORAL_TABLET | Freq: Every evening | ORAL | 0 refills | Status: DC | PRN

## 2020-10-03 NOTE — Telephone Encounter (Signed)
This is likely because a different provider refilled the medication while I was out of the office.  I can refill it for 90 tablets when she uses the supply that was just sent.

## 2020-10-03 NOTE — Telephone Encounter (Signed)
Pt called and states that she typically gets 90 pills of zolpidem (AMBIEN) 5 MG tablet and this time she only received 30. Please advise.

## 2020-11-07 ENCOUNTER — Other Ambulatory Visit: Payer: Self-pay | Admitting: Family Medicine

## 2020-11-08 ENCOUNTER — Other Ambulatory Visit: Payer: Self-pay | Admitting: Family Medicine

## 2020-11-08 ENCOUNTER — Other Ambulatory Visit: Payer: Self-pay | Admitting: Internal Medicine

## 2020-11-08 ENCOUNTER — Telehealth: Payer: Self-pay | Admitting: Family Medicine

## 2020-11-08 DIAGNOSIS — G4700 Insomnia, unspecified: Secondary | ICD-10-CM

## 2020-11-08 MED ORDER — ZOLPIDEM TARTRATE 5 MG PO TABS: 5.0000 mg | ORAL_TABLET | Freq: Every evening | ORAL | 0 refills | Status: DC | PRN

## 2020-11-08 NOTE — Telephone Encounter (Signed)
Patient called about her zolpidem (AMBIEN) 5 MG tablet. Last refill was only filled for 30 days because provider was out. She was told by office to call and remind office that she needs a 90 day refill.

## 2020-11-08 NOTE — Telephone Encounter (Signed)
Notet below sent to wrong person.

## 2020-11-08 NOTE — Telephone Encounter (Signed)
Sent to pharmacy 

## 2020-11-08 NOTE — Telephone Encounter (Signed)
Patient last seen 08/10/20 and medication last sent in 09/29/20.   Pended for your approval or denial.

## 2020-11-08 NOTE — Telephone Encounter (Signed)
Patient was sent 30 days of ambien while provider was out and she is requesting a 90 day supply. Orry Sigl,cma

## 2020-12-16 ENCOUNTER — Encounter: Payer: Self-pay | Admitting: Family Medicine

## 2020-12-26 DIAGNOSIS — S82021A Displaced longitudinal fracture of right patella, initial encounter for closed fracture: Secondary | ICD-10-CM | POA: Diagnosis not present

## 2021-01-02 ENCOUNTER — Other Ambulatory Visit: Payer: Self-pay | Admitting: Family Medicine

## 2021-01-03 ENCOUNTER — Telehealth: Payer: Self-pay | Admitting: Family Medicine

## 2021-01-03 ENCOUNTER — Encounter: Payer: Self-pay | Admitting: Family Medicine

## 2021-01-03 NOTE — Telephone Encounter (Signed)
I called and gave the patient the date of her last tdap and she understood.  Mikal Blasdell,cma

## 2021-01-03 NOTE — Telephone Encounter (Signed)
Patient is calling in to get the date of when she had her last tetanus shot done with [facility] to give to [facility].Please call her at [phone].

## 2021-01-12 ENCOUNTER — Encounter: Payer: Self-pay | Admitting: General Surgery

## 2021-02-02 ENCOUNTER — Other Ambulatory Visit: Payer: Self-pay | Admitting: Family Medicine

## 2021-02-02 DIAGNOSIS — G4700 Insomnia, unspecified: Secondary | ICD-10-CM

## 2021-02-03 NOTE — Telephone Encounter (Signed)
RX RefillLorrin Mais Last Seen: 08-10-20 Last Ordered: 11-08-20 Next Appt: 02-08-21

## 2021-02-06 ENCOUNTER — Other Ambulatory Visit: Payer: Self-pay | Admitting: Family Medicine

## 2021-02-06 DIAGNOSIS — M25561 Pain in right knee: Secondary | ICD-10-CM | POA: Diagnosis not present

## 2021-02-06 DIAGNOSIS — S82021D Displaced longitudinal fracture of right patella, subsequent encounter for closed fracture with routine healing: Secondary | ICD-10-CM | POA: Diagnosis not present

## 2021-02-06 DIAGNOSIS — E785 Hyperlipidemia, unspecified: Secondary | ICD-10-CM

## 2021-02-06 DIAGNOSIS — G4700 Insomnia, unspecified: Secondary | ICD-10-CM

## 2021-02-07 ENCOUNTER — Telehealth: Payer: Self-pay

## 2021-02-07 DIAGNOSIS — E039 Hypothyroidism, unspecified: Secondary | ICD-10-CM

## 2021-02-07 NOTE — Telephone Encounter (Signed)
It is a can you make this change.  She needs a TSH in 6 weeks.  Please call and inform the patient of this change and get her scheduled for labs.  Labs ordered.

## 2021-02-08 ENCOUNTER — Ambulatory Visit: Payer: Medicare Other | Admitting: Family Medicine

## 2021-02-08 NOTE — Telephone Encounter (Signed)
Patient called about the status of her medication. She states her pharmacy is waiting for office to ok Manufacture change.

## 2021-02-08 NOTE — Telephone Encounter (Signed)
I called and spoke with the patient and informed her that the pharmacy is aware that it is okay to change the manufacturer and I scheduled the patient for a lab recheck in 6 weeks.  Suella Cogar,cma

## 2021-02-13 ENCOUNTER — Ambulatory Visit: Payer: Medicare Other | Admitting: Family Medicine

## 2021-02-28 ENCOUNTER — Ambulatory Visit (INDEPENDENT_AMBULATORY_CARE_PROVIDER_SITE_OTHER): Payer: Medicare Other | Admitting: Nurse Practitioner

## 2021-02-28 ENCOUNTER — Other Ambulatory Visit: Payer: Self-pay

## 2021-02-28 DIAGNOSIS — J019 Acute sinusitis, unspecified: Secondary | ICD-10-CM | POA: Diagnosis not present

## 2021-02-28 MED ORDER — DOXYCYCLINE HYCLATE 100 MG PO TABS: 100.0000 mg | ORAL_TABLET | Freq: Two times a day (BID) | ORAL | 0 refills | Status: AC

## 2021-02-28 NOTE — Progress Notes (Signed)
Patient ID: Tammy Dalton, female    DOB: 12-18-1945, 75 y.o.   MRN: [id number]  Virtual visit completed through caregility, a video enabled telemedicine application. Due to national recommendations of social distancing due to COVID-19, a virtual visit is felt to be most appropriate for this patient at this time. Reviewed limitations, risks, security and privacy concerns of performing a virtual visit and the availability of in person appointments. I also reviewed that there may be a patient responsible charge related to this service. The patient agreed to proceed.   Was unable to connect via video enabled device. Revert to telephone encounter.  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, office Persons participating in this virtual visit: patient, provider   If any vitals were documented, they were collected by patient at home unless specified below.    There were no vitals taken for this visit.   CC: Cough Subjective:   HPI: Tammy Dalton is a 75 y.o. female presenting on 02/28/2021 for Cough (X 2 week ago possibly, then progressed around 1 week or less ago to headache, a little bit of runny nose/stuffy nose, post nasal drip, more seems to sinus issues. No fever, body aches or chills, or sore throat. Has been taking Tylenol, Delsym. )   Symptoms started approx 1.5 - 2 weeks ago No covid test Vaccine x2 and booster x3  Flu shot UTD this season Sick contacts at work.  OTC: delysum and tylenol. Did help  Symptom progression: Staying the same     Relevant past medical, surgical, family and social history reviewed and updated as indicated. Interim medical history since our last visit reviewed. Allergies and medications reviewed and updated. Outpatient Medications Prior to Visit  Medication Sig Dispense Refill   aspirin EC 81 MG tablet Take 81 mg by mouth daily. Taking every other day.     cholecalciferol  (VITAMIN D) 1000 units tablet Take 1,000 Units by mouth daily.     ferrous sulfate 325 (65 FE) MG tablet Take 325 mg by mouth daily with breakfast.     levothyroxine (SYNTHROID) 100 MCG tablet TAKE 1 TABLET BY MOUTH ONCE DAILY BEFORE BREAKFAST 90 tablet 0   lisinopril-hydrochlorothiazide (ZESTORETIC) 20-25 MG tablet Take 1 tablet by mouth once daily 90 tablet 0   rosuvastatin (CRESTOR) 40 MG tablet Take 1 tablet by mouth once daily 90 tablet 0   zolpidem (AMBIEN) 5 MG tablet TAKE 1 TABLET BY MOUTH AT BEDTIME AS NEEDED FOR SLEEP 90 tablet 0   Omega-3 Fatty Acids (FISH OIL) 1200 MG CAPS Take by mouth. (Patient not taking: Reported on 08/10/2020)     simvastatin (ZOCOR) 40 MG tablet TAKE 1 TABLET BY MOUTH ONCE DAILY AT  6PM (Patient not taking: Reported on 08/10/2020) 90 tablet 0   No facility-administered medications prior to visit.     Per HPI unless specifically indicated in ROS section below Review of Systems  Constitutional:  Positive for fatigue. Negative for chills and fever.  HENT:  Positive for congestion, sinus pressure and sinus pain. Negative for ear discharge, ear pain and sore throat.   Respiratory:  Positive for cough (dry). Negative for shortness of breath.   Cardiovascular:  Negative for chest pain.  Gastrointestinal:  Negative for abdominal pain, diarrhea, nausea and vomiting.  Musculoskeletal:  Negative for arthralgias and myalgias.  Neurological:  Positive for headaches.  Objective:  There were no vitals taken for this visit.  Wt Readings from Last 3 Encounters:  08/10/20 141 lb 1.9 oz (64 kg)  04/07/20 140 lb (63.5 kg)  02/03/20 140 lb 6.4 oz (63.7 kg)       Physical exam: Gen: alert, NAD, not ill appearing Pulm: speaks in complete sentences without increased work of breathing Psych: normal mood, normal thought content      Results for orders placed or performed in visit on 09/16/20  HM DEXA SCAN  Result Value Ref Range   HM Dexa Scan see report    Assessment  & Plan:   Problem List Items Addressed This Visit       Respiratory   Acute non-recurrent sinusitis - Primary    Given length of time, constellation of symptoms, patient age will elect to treat for sinusitis.  This is atypical in nature we will start her on doxycycline 100 mg twice daily for 7 days.  Signs and symptoms reviewed as when to seek urgent or emergent health care.  Patient acknowledged.  Follow-up if no improvement or symptoms worsen      Relevant Medications   doxycycline (VIBRA-TABS) 100 MG tablet     No orders of the defined types were placed in this encounter.  No orders of the defined types were placed in this encounter.   I discussed the assessment and treatment plan with the patient. The patient was provided an opportunity to ask questions and all were answered. The patient agreed with the plan and demonstrated an understanding of the instructions. The patient was advised to call back or seek an in-person evaluation if the symptoms worsen or if the condition fails to improve as anticipated.  Follow up plan: No follow-ups on file.  Romilda Garret, NP

## 2021-02-28 NOTE — Assessment & Plan Note (Signed)
Given length of time, constellation of symptoms, patient age will elect to treat for sinusitis.  This is atypical in nature we will start her on doxycycline 100 mg twice daily for 7 days.  Signs and symptoms reviewed as when to seek urgent or emergent health care.  Patient acknowledged.  Follow-up if no improvement or symptoms worsen

## 2021-03-22 ENCOUNTER — Other Ambulatory Visit (INDEPENDENT_AMBULATORY_CARE_PROVIDER_SITE_OTHER): Payer: Medicare Other

## 2021-03-22 ENCOUNTER — Other Ambulatory Visit: Payer: Self-pay

## 2021-03-22 DIAGNOSIS — E039 Hypothyroidism, unspecified: Secondary | ICD-10-CM | POA: Diagnosis not present

## 2021-03-22 LAB — TSH: TSH: 1.81 u[IU]/mL (ref 0.35–5.50)

## 2021-03-28 ENCOUNTER — Other Ambulatory Visit: Payer: Self-pay | Admitting: Family Medicine

## 2021-04-10 ENCOUNTER — Ambulatory Visit: Payer: Medicare Other

## 2021-04-14 ENCOUNTER — Ambulatory Visit (INDEPENDENT_AMBULATORY_CARE_PROVIDER_SITE_OTHER): Payer: Medicare Other

## 2021-04-14 VITALS — Ht 62.0 in | Wt 141.0 lb

## 2021-04-14 DIAGNOSIS — Z0000 Encounter for general adult medical examination without abnormal findings: Secondary | ICD-10-CM | POA: Diagnosis not present

## 2021-04-14 NOTE — Progress Notes (Signed)
Subjective:   Tammy Dalton is a 76 y.o. female who presents for Medicare Annual (Subsequent) preventive examination.  Review of Systems    No ROS.  Medicare Wellness Virtual Visit.  Visual/audio telehealth visit, UTA vital signs.   See social history for additional risk factors.   Cardiac Risk Factors include: advanced age (>68men, >33 women)     Objective:    Today's Vitals   04/14/21 1120  Weight: 141 lb (64 kg)  Height: 5\' 2"  (1.575 m)   Body mass index is 25.79 kg/m.  Advanced Directives 04/14/2021 04/07/2020 04/02/2019 01/13/2018 04/13/2017 12/24/2016 04/24/2016  Does Patient Have a Medical Advance Directive? Yes Yes Yes Yes No Yes Yes  Type of Paramedic of [place];Living will [place];Living will [place];Living will [place];Living will - [place];Living will [place];Living will  Does patient want to make changes to medical advance directive? No - Patient declined No - Patient declined No - Patient declined No - Patient declined - No - Patient declined -  Copy of [place] in Chart? No - copy requested No - copy requested No - copy requested No - copy requested - No - copy requested No - copy requested  Would patient like information on creating a medical advance directive? - - - - No - Patient declined - -    Current Medications (verified) Outpatient Encounter Medications as of 04/14/2021  Medication Sig   aspirin EC 81 MG tablet Take 81 mg by mouth daily. Taking every other day.   cholecalciferol (VITAMIN D) 1000 units tablet Take 1,000 Units by mouth daily.   ferrous sulfate 325 (65 FE) MG tablet Take 325 mg by mouth daily with breakfast.   levothyroxine (SYNTHROID) 100 MCG tablet TAKE 1 TABLET BY MOUTH ONCE DAILY BEFORE BREAKFAST   lisinopril-hydrochlorothiazide (ZESTORETIC) 20-25 MG tablet Take 1 tablet by mouth once daily    rosuvastatin (CRESTOR) 40 MG tablet Take 1 tablet by mouth once daily   zolpidem (AMBIEN) 5 MG tablet TAKE 1 TABLET BY MOUTH AT BEDTIME AS NEEDED FOR SLEEP   No facility-administered encounter medications on file as of 04/14/2021.    Allergies (verified) Codeine   History: Past Medical History:  Diagnosis Date   Hyperlipidemia    Hypertension    Thyroid disease    Past Surgical History:  Procedure Laterality Date   anal sphincterotomy and fissurectomy  2009   COLONOSCOPY  05/24/2011   Dr. Jill Side done at [facility]  05/20/2000   Dr. Jamal Collin; Sharp bend at 50 cm. Scope could not be advanced any further even with glucagon.   COLONOSCOPY WITH PROPOFOL N/A 04/24/2016   Procedure: COLONOSCOPY WITH PROPOFOL;  Surgeon: Christene Lye, MD;  Location: [facility] ENDOSCOPY;  Service: Endoscopy;  Laterality: N/A;   EYE SURGERY Bilateral 2010   cataract surgery.    TONSILLECTOMY     Family History  Problem Relation Age of Onset   Depression Mother    Heart disease Father    Early death Sister    Alcohol abuse Brother    Stroke Paternal Grandfather    Social History   Socioeconomic History   Marital status: Single    Spouse name: Not on file   Number of children: Not on file   Years of education: Not on file   Highest education level: Not on file  Occupational History  Not on file  Tobacco Use   Smoking status: Former    Types: Cigarettes   Smokeless tobacco: Never  Vaping Use   Vaping Use: Never used  Substance and Sexual Activity   Alcohol use: No   Drug use: No   Sexual activity: Never  Other Topics Concern   Not on file  Social History Narrative   Not on file   Social Determinants of Health   Financial Resource Strain: Low Risk    Difficulty of Paying Living Expenses: Not hard at all  Food Insecurity: No Food Insecurity   Worried About Charity fundraiser in the Last Year: Never true   [place] in the Last  Year: Never true  Transportation Needs: No Transportation Needs   Lack of Transportation (Medical): No   Lack of Transportation (Non-Medical): No  Physical Activity: Not on file  Stress: No Stress Concern Present   Feeling of Stress : Not at all  Social Connections: Unknown   Frequency of Communication with Friends and Family: More than three times a week   Frequency of Social Gatherings with Friends and Family: More than three times a week   Attends Religious Services: Not on Electrical engineer or Organizations: Not on file   Attends Archivist Meetings: Not on file   Marital Status: Not on file    Tobacco Counseling Counseling given: Not Answered   Clinical Intake:  Pre-visit preparation completed: Yes        Diabetes: No  How often do you need to have someone help you when you read instructions, pamphlets, or other written materials from your doctor or pharmacy?: 1 - Never  Interpreter Needed?: No      Activities of Daily Living In your present state of health, do you have any difficulty performing the following activities: 04/14/2021  Hearing? N  Vision? N  Difficulty concentrating or making decisions? N  Walking or climbing stairs? N  Dressing or bathing? N  Doing errands, shopping? N  Preparing Food and eating ? N  Using the Toilet? N  In the past six months, have you accidently leaked urine? N  Do you have problems with loss of bowel control? N  Managing your Medications? N  Managing your Finances? N  Housekeeping or managing your Housekeeping? N  Some recent data might be hidden    Patient Care Team: Leone Haven, MD as PCP - General (Family Medicine) Christene Lye, MD (General Surgery)  Indicate any recent Medical Services you may have received from other than [facility] providers in the past year (date may be approximate).     Assessment:   This is a routine wellness examination for [place].  Virtual Visit via  Telephone Note  I connected with  Tammy Dalton on 04/14/21 at 11:15 AM EST by telephone and verified that I am speaking with the correct person using two identifiers.  Persons participating in the virtual visit: patient/Nurse Health Advisor   I discussed the limitations, risks, security and privacy concerns of performing an evaluation and management service by telephone and the availability of in person appointments. The patient expressed understanding and agreed to proceed.  Interactive audio and video telecommunications were attempted between this nurse and patient, however failed, due to patient having technical difficulties OR patient did not have access to video capability.  We continued and completed visit with audio only.  Some vital signs may be absent or patient reported.  Hearing/Vision screen Hearing Screening - Comments:: Patient is able to hear conversational tones without difficulty.  No issues reported.  Dietary issues and exercise activities discussed: Current Exercise Habits: Home exercise routine, Intensity: Mild   Goals Addressed               This Visit's Progress     Patient Stated     Maintain healthy lifestyle (pt-stated)        Stay active Healthy diet       Depression Screen PHQ 2/9 Scores 04/14/2021 04/07/2020 02/03/2020 08/03/2019 04/02/2019 12/16/2018 01/13/2018  PHQ - 2 Score 0 0 0 0 0 0 0    Fall Risk Fall Risk  04/14/2021 04/07/2020 02/03/2020 08/03/2019 04/02/2019  Falls in the past year? 0 0 0 0 0  Number falls in past yr: 0 0 0 0 -  Injury with Fall? - 0 - - -  Comment - - - - -  Follow up Falls evaluation completed Falls evaluation completed Falls evaluation completed Falls evaluation completed Falls prevention discussed   FALL RISK PREVENTION PERTAINING TO THE HOME: Home free of loose throw rugs in walkways, pet beds, electrical cords, etc? Yes  Adequate lighting in your home to reduce risk of falls? Yes   ASSISTIVE DEVICES UTILIZED TO  PREVENT FALLS: Use of a cane, walker or w/c? No   TIMED UP AND GO: Was the test performed? No .   Cognitive Function: Patient is alert and oriented x3.  MMSE - Mini Mental State Exam 01/13/2018 12/24/2016  Orientation to time 5 5  Orientation to Place 5 5  Registration 3 3  Attention/ Calculation 5 5  Recall 3 3  Language- name 2 objects 2 2  Language- repeat 1 1  Language- follow 3 step command 3 3  Language- read & follow direction 1 1  Write a sentence 1 1  Copy design 1 1  Total score 30 30       Immunizations Immunization History  Administered Date(s) Administered   Influenza Nasal 12/16/2020   Influenza, High Dose Seasonal PF 12/24/2016, 12/02/2017, 12/15/2018   Influenza-Unspecified 11/25/2015, 12/02/2017, 12/18/2019   PFIZER(Purple Top)SARS-COV-2 Vaccination 05/01/2019, 05/22/2019, 01/07/2020, 08/12/2020   Pfizer Covid-19 Vaccine Bivalent Booster 42yrs & up 01/03/2021   Pneumococcal Conjugate-13 06/28/2017   Pneumococcal Polysaccharide-23 06/30/2018   Pneumococcal-Unspecified 05/25/2011   Tdap 08/01/2015, 04/13/2017   Zoster Recombinat (Shingrix) 06/13/2017, 09/13/2017   Zoster, Live 03/27/2007   Screening Tests Health Maintenance  Topic Date Due   MAMMOGRAM  02/28/2022   COLONOSCOPY (Pts 45-53yrs Insurance coverage will need to be confirmed)  04/24/2026   TETANUS/TDAP  04/14/2027   Pneumonia Vaccine 43+ Years old  Completed   INFLUENZA VACCINE  Completed   DEXA SCAN  Completed   COVID-19 Vaccine  Completed   Hepatitis C Screening  Completed   Zoster Vaccines- Shingrix  Completed   HPV VACCINES  Aged Out   Health Maintenance There are no preventive care reminders to display for this patient.  Lung Cancer Screening: (Low Dose CT Chest recommended if Age 84-80 years, 30 pack-year currently smoking OR have quit w/in 15years.) does not qualify.   Vision Screening: Recommended annual ophthalmology exams for early detection of glaucoma and other disorders of  the eye.  Dental Screening: Recommended annual dental exams for proper oral hygiene  Community Resource Referral / Chronic Care Management: CRR required this visit?  No   CCM required this visit?  No      Plan:   Keep  all routine maintenance appointments.   I have personally reviewed and noted the following in the patients chart:   Medical and social history Use of alcohol, tobacco or illicit drugs  Current medications and supplements including opioid prescriptions. Not taking opioids.  Functional ability and status Nutritional status Physical activity Advanced directives List of other physicians Hospitalizations, surgeries, and ER visits in previous 12 months Vitals Screenings to include cognitive, depression, and falls Referrals and appointments  In addition, I have reviewed and discussed with patient certain preventive protocols, quality metrics, and best practice recommendations. A written personalized care plan for preventive services as well as general preventive health recommendations were provided to patient.     Varney Biles, LPN   [id number]/53/[id number]

## 2021-04-14 NOTE — Patient Instructions (Addendum)
Tammy Dalton , Thank you for taking time to come for your Medicare Wellness Visit. I appreciate your ongoing commitment to your health goals. Please review the following plan we discussed and let me know if I can assist you in the future.   These are the goals we discussed:  Goals       Patient Stated     Follow up with Primary Care Provider (pt-stated)      As needed      Maintain healthy lifestyle (pt-stated)      Stay active Healthy diet        This is a list of the screening recommended for you and due dates:  Health Maintenance  Topic Date Due   Mammogram  02/28/2022   Colon Cancer Screening  04/24/2026   Tetanus Vaccine  04/14/2027   Pneumonia Vaccine  Completed   Flu Shot  Completed   DEXA scan (bone density measurement)  Completed   COVID-19 Vaccine  Completed   Hepatitis C Screening: USPSTF Recommendation to screen - Ages 33-79 yo.  Completed   Zoster (Shingles) Vaccine  Completed   HPV Vaccine  Aged Out    Advanced directives: End of life planning; Advance aging; Advanced directives discussed.  Copy of current HCPOA/Living Will requested.    Conditions/risks identified: none new.  Follow up in one year for your annual wellness visit    Preventive Care 65 Years and Older, Female Preventive care refers to lifestyle choices and visits with your health care provider that can promote health and wellness. What does preventive care include? A yearly physical exam. This is also called an annual well check. Dental exams once or twice a year. Routine eye exams. Ask your health care provider how often you should have your eyes checked. Personal lifestyle choices, including: Daily care of your teeth and gums. Regular physical activity. Eating a healthy diet. Avoiding tobacco and drug use. Limiting alcohol use. Practicing safe sex. Taking low-dose aspirin every day. Taking vitamin and mineral supplements as recommended by your health care provider. What happens during  an annual well check? The services and screenings done by your health care provider during your annual well check will depend on your age, overall health, lifestyle risk factors, and family history of disease. Counseling  Your health care provider may ask you questions about your: Alcohol use. Tobacco use. Drug use. Emotional well-being. Home and relationship well-being. Sexual activity. Eating habits. History of falls. Memory and ability to understand (cognition). Work and work Statistician. Reproductive health. Screening  You may have the following tests or measurements: Height, weight, and BMI. Blood pressure. Lipid and cholesterol levels. These may be checked every 5 years, or more frequently if you are over 11 years old. Skin check. Lung cancer screening. You may have this screening every year starting at age 66 if you have a 30-pack-year history of smoking and currently smoke or have quit within the past 15 years. Fecal occult blood test (FOBT) of the stool. You may have this test every year starting at age 58. Flexible sigmoidoscopy or colonoscopy. You may have a sigmoidoscopy every 5 years or a colonoscopy every 10 years starting at age 67. Hepatitis C blood test. Hepatitis B blood test. Sexually transmitted disease (STD) testing. Diabetes screening. This is done by checking your blood sugar (glucose) after you have not eaten for a while (fasting). You may have this done every 1-3 years. Bone density scan. This is done to screen for osteoporosis. You may have  this done starting at age 82. Mammogram. This may be done every 1-2 years. Talk to your health care provider about how often you should have regular mammograms. Talk with your health care provider about your test results, treatment options, and if necessary, the need for more tests. Vaccines  Your health care provider may recommend certain vaccines, such as: Influenza vaccine. This is recommended every year. Tetanus,  diphtheria, and acellular pertussis (Tdap, Td) vaccine. You may need a Td booster every 10 years. Zoster vaccine. You may need this after age 39. Pneumococcal 13-valent conjugate (PCV13) vaccine. One dose is recommended after age 73. Pneumococcal polysaccharide (PPSV23) vaccine. One dose is recommended after age 29. Talk to your health care provider about which screenings and vaccines you need and how often you need them. This information is not intended to replace advice given to you by your health care provider. Make sure you discuss any questions you have with your health care provider. Document Released: 04/08/2015 Document Revised: 11/30/2015 Document Reviewed: 01/11/2015 Elsevier Interactive Patient Education  2017 [place] Prevention in the Home Falls can cause injuries. They can happen to people of all ages. There are many things you can do to make your home safe and to help prevent falls. What can I do on the outside of my home? Regularly fix the edges of walkways and driveways and fix any cracks. Remove anything that might make you trip as you walk through a door, such as a raised step or threshold. Trim any bushes or trees on the path to your home. Use bright outdoor lighting. Clear any walking paths of anything that might make someone trip, such as rocks or tools. Regularly check to see if handrails are loose or broken. Make sure that both sides of any steps have handrails. Any raised decks and porches should have guardrails on the edges. Have any leaves, snow, or ice cleared regularly. Use sand or salt on walking paths during winter. Clean up any spills in your garage right away. This includes oil or grease spills. What can I do in the bathroom? Use night lights. Install grab bars by the toilet and in the tub and shower. Do not use towel bars as grab bars. Use non-skid mats or decals in the tub or shower. If you need to sit down in the shower, use a plastic,  non-slip stool. Keep the floor dry. Clean up any water that spills on the floor as soon as it happens. Remove soap buildup in the tub or shower regularly. Attach bath mats securely with double-sided non-slip rug tape. Do not have throw rugs and other things on the floor that can make you trip. What can I do in the bedroom? Use night lights. Make sure that you have a light by your bed that is easy to reach. Do not use any sheets or blankets that are too big for your bed. They should not hang down onto the floor. Have a firm chair that has side arms. You can use this for support while you get dressed. Do not have throw rugs and other things on the floor that can make you trip. What can I do in the kitchen? Clean up any spills right away. Avoid walking on wet floors. Keep items that you use a lot in easy-to-reach places. If you need to reach something above you, use a strong step stool that has a grab bar. Keep electrical cords out of the way. Do not use floor polish or  wax that makes floors slippery. If you must use wax, use non-skid floor wax. Do not have throw rugs and other things on the floor that can make you trip. What can I do with my stairs? Do not leave any items on the stairs. Make sure that there are handrails on both sides of the stairs and use them. Fix handrails that are broken or loose. Make sure that handrails are as long as the stairways. Check any carpeting to make sure that it is firmly attached to the stairs. Fix any carpet that is loose or worn. Avoid having throw rugs at the top or bottom of the stairs. If you do have throw rugs, attach them to the floor with carpet tape. Make sure that you have a light switch at the top of the stairs and the bottom of the stairs. If you do not have them, ask someone to add them for you. What else can I do to help prevent falls? Wear shoes that: Do not have high heels. Have rubber bottoms. Are comfortable and fit you well. Are closed  at the toe. Do not wear sandals. If you use a stepladder: Make sure that it is fully opened. Do not climb a closed stepladder. Make sure that both sides of the stepladder are locked into place. Ask someone to hold it for you, if possible. Clearly mark and make sure that you can see: Any grab bars or handrails. First and last steps. Where the edge of each step is. Use tools that help you move around (mobility aids) if they are needed. These include: Canes. Walkers. Scooters. Crutches. Turn on the lights when you go into a dark area. Replace any light bulbs as soon as they burn out. Set up your furniture so you have a clear path. Avoid moving your furniture around. If any of your floors are uneven, fix them. If there are any pets around you, be aware of where they are. Review your medicines with your doctor. Some medicines can make you feel dizzy. This can increase your chance of falling. Ask your doctor what other things that you can do to help prevent falls. This information is not intended to replace advice given to you by your health care provider. Make sure you discuss any questions you have with your health care provider. Document Released: 01/06/2009 Document Revised: 08/18/2015 Document Reviewed: 04/16/2014 Elsevier Interactive Patient Education  2017 [employer].

## 2021-05-07 ENCOUNTER — Other Ambulatory Visit: Payer: Self-pay | Admitting: Family Medicine

## 2021-05-07 DIAGNOSIS — E785 Hyperlipidemia, unspecified: Secondary | ICD-10-CM

## 2021-05-08 ENCOUNTER — Telehealth: Payer: Self-pay | Admitting: Family Medicine

## 2021-05-08 DIAGNOSIS — G4700 Insomnia, unspecified: Secondary | ICD-10-CM

## 2021-05-09 DIAGNOSIS — Z961 Presence of intraocular lens: Secondary | ICD-10-CM | POA: Diagnosis not present

## 2021-05-11 ENCOUNTER — Other Ambulatory Visit: Payer: Self-pay | Admitting: Family Medicine

## 2021-05-11 DIAGNOSIS — G4700 Insomnia, unspecified: Secondary | ICD-10-CM

## 2021-05-12 ENCOUNTER — Other Ambulatory Visit: Payer: Self-pay

## 2021-05-12 ENCOUNTER — Ambulatory Visit (INDEPENDENT_AMBULATORY_CARE_PROVIDER_SITE_OTHER): Payer: Medicare Other | Admitting: Family Medicine

## 2021-05-12 ENCOUNTER — Encounter: Payer: Self-pay | Admitting: Family Medicine

## 2021-05-12 VITALS — BP 153/71 | HR 79 | Ht 62.0 in | Wt 141.0 lb

## 2021-05-12 DIAGNOSIS — J029 Acute pharyngitis, unspecified: Secondary | ICD-10-CM | POA: Diagnosis not present

## 2021-05-12 MED ORDER — AMOXICILLIN 875 MG PO TABS: 875.0000 mg | ORAL_TABLET | Freq: Two times a day (BID) | ORAL | 0 refills | Status: DC

## 2021-05-12 NOTE — Telephone Encounter (Signed)
I called the patient and informed her that the RX was sent on 05/08/2021 and she stated she would call the pharmacy. Tammy Dalton,cma

## 2021-05-12 NOTE — Progress Notes (Signed)
° °  ANGIE PIERCEY is a 76 y.o. female who presents today for a telephone visit.  Assessment/Plan:  New/Acute Problems: Sore Throat Likely viral URI.  We discussed limitations of telephone visit and inability perform physical exam or any sort of diagnostic testing.  Given that her symptoms only started a few hours ago we discussed watchful waiting.  She will continue with over-the-counter meds.  Encouraged hydration.  I will send in the pocket prescription for amoxicillin for her to use over the weekend if symptoms worsen or she develops any new symptoms.  She will get tested for COVID later today.  We discussed reasons to return to care.    Subjective:  HPI:  Patient woke up this morning with sore throat. She has had some drainage and congestion. No fevers or chills. Tried taking tylenol and dayquil. Not sure if its helping. No sick contacts. No other symptoms.        Objective/Observations   NAD  Telephone Visit   I connected with CHARONDA HEFTER on 05/12/21 at 11:40 AM EST via telephone and verified that I am speaking with the correct person using two identifiers. I discussed the limitations of evaluation and management by telemedicine and the availability of in person appointments. The patient expressed understanding and agreed to proceed.   Patient location: Home Provider location: [place] participating in the virtual visit: Myself and Patient   A total of 6 minutes were spent on medical discussion.      Algis Greenhouse. Jerline Pain, MD 05/12/2021 10:36 AM

## 2021-05-12 NOTE — Telephone Encounter (Signed)
Pt called wanting an update on med refill. Pt stated that [facility] does not have it

## 2021-05-15 ENCOUNTER — Other Ambulatory Visit: Payer: Self-pay

## 2021-05-15 ENCOUNTER — Ambulatory Visit (INDEPENDENT_AMBULATORY_CARE_PROVIDER_SITE_OTHER): Payer: Medicare Other | Admitting: Family Medicine

## 2021-05-15 ENCOUNTER — Encounter: Payer: Self-pay | Admitting: Family Medicine

## 2021-05-15 VITALS — BP 130/80 | HR 72 | Temp 98.7°F | Ht 62.0 in | Wt 138.2 lb

## 2021-05-15 DIAGNOSIS — E785 Hyperlipidemia, unspecified: Secondary | ICD-10-CM

## 2021-05-15 DIAGNOSIS — G479 Sleep disorder, unspecified: Secondary | ICD-10-CM | POA: Diagnosis not present

## 2021-05-15 DIAGNOSIS — I10 Essential (primary) hypertension: Secondary | ICD-10-CM | POA: Diagnosis not present

## 2021-05-15 DIAGNOSIS — E039 Hypothyroidism, unspecified: Secondary | ICD-10-CM

## 2021-05-15 DIAGNOSIS — R0982 Postnasal drip: Secondary | ICD-10-CM

## 2021-05-15 LAB — COMPREHENSIVE METABOLIC PANEL
ALT: 36 U/L — ABNORMAL HIGH (ref 0–35)
AST: 32 U/L (ref 0–37)
Albumin: 4.4 g/dL (ref 3.5–5.2)
Alkaline Phosphatase: 77 U/L (ref 39–117)
BUN: 14 mg/dL (ref 6–23)
CO2: 32 mEq/L (ref 19–32)
Calcium: 9.4 mg/dL (ref 8.4–10.5)
Chloride: 101 mEq/L (ref 96–112)
Creatinine, Ser: 1.14 mg/dL (ref 0.40–1.20)
GFR: 47.43 mL/min — ABNORMAL LOW (ref >60.00)
Glucose, Bld: 95 mg/dL (ref 70–99)
Potassium: 3.7 mEq/L (ref 3.5–5.1)
Sodium: 137 mEq/L (ref 135–145)
Total Bilirubin: 0.5 mg/dL (ref 0.2–1.2)
Total Protein: 6.9 g/dL (ref 6.0–8.3)

## 2021-05-15 LAB — LDL CHOLESTEROL, DIRECT: Direct LDL: 42 mg/dL

## 2021-05-15 LAB — LIPID PANEL
Cholesterol: 161 mg/dL (ref 0–200)
HDL: 53.6 mg/dL (ref >39.00)
NonHDL: 107.41
Total CHOL/HDL Ratio: 3
Triglycerides: 221 mg/dL — ABNORMAL HIGH (ref 0.0–149.0)
VLDL: 44.2 mg/dL — ABNORMAL HIGH (ref 0.0–40.0)

## 2021-05-15 NOTE — Assessment & Plan Note (Signed)
Check lipid panel.  Continue Crestor 40 mg once daily.

## 2021-05-15 NOTE — Assessment & Plan Note (Signed)
Well-controlled.  She will continue lisinopril-HCTZ 1 tablet once daily.  Check CMP.

## 2021-05-15 NOTE — Assessment & Plan Note (Signed)
Chronic ongoing issue.  Discussed that the maximum dose of Ambien for a female is 5 mg nightly.  Discussed that rarely taking 10 mg would be okay as long as she notes no drowsiness the next day.  Discussed consistently going to bed and waking up at the same time as a consistent sleep-wake cycle will help her sleep better.

## 2021-05-15 NOTE — Assessment & Plan Note (Signed)
Suspect viral illness or allergies.  She had 2 negative home COVID test.  She will monitor symptoms and if they do not continue to improve she will let us know.

## 2021-05-15 NOTE — Patient Instructions (Addendum)
Nice to see you. We will get lab work today and contact you with the results. Please work on the sleep changes that we discussed. If your postnasal drip does not resolve please let me know.

## 2021-05-15 NOTE — Progress Notes (Signed)
[name], MD Phone: [phone]  Tammy Dalton is a 76 y.o. female who presents today for f/u.  HYPERTENSION Disease Monitoring Home BP Monitoring 098-119 systolic Chest pain- no    Dyspnea- no Medications Compliance-  taking lisinopril/HCTZ.   Edema- no BMET    Component Value Date/Time   NA 140 08/10/2020 1554   K 3.7 08/10/2020 1554   CL 102 08/10/2020 1554   CO2 28 08/10/2020 1554   GLUCOSE 84 08/10/2020 1554   BUN 23 08/10/2020 1554   CREATININE 1.13 08/10/2020 1554   CREATININE 1.07 (H) 07/10/2019 1625   CALCIUM 9.4 08/10/2020 1554   HYPERLIPIDEMIA Symptoms Chest pain on exertion:  no   Medications: Compliance- taking Crestor right upper quadrant pain- no  Muscle aches- no Lipid Panel     Component Value Date/Time   CHOL 196 02/03/2020 1607   TRIG 198.0 (H) 02/03/2020 1607   HDL 47.20 02/03/2020 1607   CHOLHDL 4 02/03/2020 1607   VLDL 39.6 02/03/2020 1607   LDLCALC 109 (H) 02/03/2020 1607   LDLDIRECT 41.0 03/30/2020 0812   HYPOTHYROIDISM Disease Monitoring Weight changes: no  Skin Changes: no Heat/Cold intolerance: no  Medication Monitoring Compliance:  taking synthroid   Last TSH:   Lab Results  Component Value Date   TSH 1.81 03/22/2021   Insomnia: Generally stable.  She gets about 5 hours of sleep with the Ambien 5 mg nightly.  She notes on Sunday she has more trouble and she ends up taking an extra 5 mg tablet.  She denies significant anxiety or depression.  She goes to bed around 930 or 10 PM.  On Monday, Tuesday, and Wednesday she wakes up at 5:30 AM.  The other days of the week she wakes up between 7:30 AM and 8 AM.  She tries to avoid screens.  Sore throat: Patient was seen last week for a virtual visit for sore throat.  She had some postnasal drip.  She notes no congestion, fevers, or coughing.  Minimal sneezing.  She reports a negative COVID test the day after symptom onset and 3 days after symptom onset.  She notes her sore throat has  resolved and she has minimal drainage at this time.  Social History   Tobacco Use  Smoking Status Former   Types: Cigarettes  Smokeless Tobacco Never    Current Outpatient Medications on File Prior to Visit  Medication Sig Dispense Refill   amoxicillin (AMOXIL) 875 MG tablet Take 1 tablet (875 mg total) by mouth 2 (two) times daily. 14 tablet 0   aspirin EC 81 MG tablet Take 81 mg by mouth daily. Taking every other day.     cholecalciferol (VITAMIN D) 1000 units tablet Take 1,000 Units by mouth daily.     ferrous sulfate 325 (65 FE) MG tablet Take 325 mg by mouth daily with breakfast.     levothyroxine (SYNTHROID) 100 MCG tablet TAKE 1 TABLET BY MOUTH ONCE DAILY BEFORE BREAKFAST 90 tablet 0   lisinopril-hydrochlorothiazide (ZESTORETIC) 20-25 MG tablet Take 1 tablet by mouth once daily 90 tablet 1   rosuvastatin (CRESTOR) 40 MG tablet Take 1 tablet by mouth once daily 90 tablet 0   zolpidem (AMBIEN) 5 MG tablet TAKE 1 TABLET BY MOUTH AT BEDTIME AS NEEDED FOR SLEEP 90 tablet 0   No current facility-administered medications on file prior to visit.     ROS see history of present illness  Objective  Physical Exam Vitals:   05/15/21 1454  BP: 130/80  Pulse: 72  Temp: 98.7 F (37.1 C)  SpO2: 98%    BP Readings from Last 3 Encounters:  05/15/21 130/80  05/12/21 (!) 153/71  08/10/20 120/70   Wt Readings from Last 3 Encounters:  05/15/21 138 lb 3.2 oz (62.7 kg)  05/12/21 141 lb (64 kg)  04/14/21 141 lb (64 kg)    Physical Exam Constitutional:      General: She is not in acute distress.    Appearance: She is not diaphoretic.  Cardiovascular:     Rate and Rhythm: Normal rate and regular rhythm.     Heart sounds: Normal heart sounds.  Pulmonary:     Effort: Pulmonary effort is normal.     Breath sounds: Normal breath sounds.  Musculoskeletal:     Right lower leg: No edema.     Left lower leg: No edema.  Skin:    General: Skin is warm and dry.  Neurological:      Mental Status: She is alert.     Assessment/Plan: Please see individual problem list.  Problem List Items Addressed This Visit     Hyperlipidemia    Check lipid panel.  Continue Crestor 40 mg once daily.      Relevant Orders   Lipid panel   Hypertension - Primary    Well-controlled.  She will continue lisinopril-HCTZ 1 tablet once daily.  Check CMP.      Relevant Orders   Comp Met (CMET)   Hypothyroidism    Adequately controlled.  She will continue Synthroid 100 mcg once daily.      Postnasal drip    Suspect viral illness or allergies.  She had 2 negative home COVID test.  She will monitor symptoms and if they do not continue to improve she will let us know.      Sleeping difficulty    Chronic ongoing issue.  Discussed that the maximum dose of Ambien for a female is 5 mg nightly.  Discussed that rarely taking 10 mg would be okay as long as she notes no drowsiness the next day.  Discussed consistently going to bed and waking up at the same time as a consistent sleep-wake cycle will help her sleep better.        Health Maintenance: The patient defers her mammogram to every 2 years.  Discussed we would plan on ordering this at her next visit.  Return in about 6 months (around 11/12/2021).  This visit occurred during the SARS-CoV-2 public health emergency.  Safety protocols were in place, including screening questions prior to the visit, additional usage of staff PPE, and extensive cleaning of exam room while observing appropriate contact time as indicated for disinfecting solutions.    [name], MD [place]

## 2021-05-15 NOTE — Assessment & Plan Note (Signed)
Adequately controlled.  She will continue Synthroid 100 mcg once daily.

## 2021-05-17 ENCOUNTER — Telehealth: Payer: Self-pay | Admitting: Family Medicine

## 2021-05-17 DIAGNOSIS — D2262 Melanocytic nevi of left upper limb, including shoulder: Secondary | ICD-10-CM | POA: Diagnosis not present

## 2021-05-17 DIAGNOSIS — D2272 Melanocytic nevi of left lower limb, including hip: Secondary | ICD-10-CM | POA: Diagnosis not present

## 2021-05-17 DIAGNOSIS — L570 Actinic keratosis: Secondary | ICD-10-CM | POA: Diagnosis not present

## 2021-05-17 DIAGNOSIS — L298 Other pruritus: Secondary | ICD-10-CM | POA: Diagnosis not present

## 2021-05-17 DIAGNOSIS — X32XXXA Exposure to sunlight, initial encounter: Secondary | ICD-10-CM | POA: Diagnosis not present

## 2021-05-17 DIAGNOSIS — D0472 Carcinoma in situ of skin of left lower limb, including hip: Secondary | ICD-10-CM | POA: Diagnosis not present

## 2021-05-17 DIAGNOSIS — Z85828 Personal history of other malignant neoplasm of skin: Secondary | ICD-10-CM | POA: Diagnosis not present

## 2021-05-17 DIAGNOSIS — L538 Other specified erythematous conditions: Secondary | ICD-10-CM | POA: Diagnosis not present

## 2021-05-17 DIAGNOSIS — D485 Neoplasm of uncertain behavior of skin: Secondary | ICD-10-CM | POA: Diagnosis not present

## 2021-05-17 DIAGNOSIS — D2261 Melanocytic nevi of right upper limb, including shoulder: Secondary | ICD-10-CM | POA: Diagnosis not present

## 2021-05-17 DIAGNOSIS — L820 Inflamed seborrheic keratosis: Secondary | ICD-10-CM | POA: Diagnosis not present

## 2021-05-17 NOTE — Telephone Encounter (Signed)
Patient call about the status of note below. She wanted to let office know she will not be able to answer her phone around 3:30. Ok to leave a detailed message.

## 2021-05-17 NOTE — Telephone Encounter (Signed)
Pt called in stating that she had an OV with Dr. Caryl Bis on Monday,05/15/2021. Pt advise dr. Caryl Bis about her sinus issues. Pt stated that Dr. Caryl Bis advised her if her symptoms doesn't get any better to callback and he will prescribe her something. Pt stated that she thinks its her sinus that is acting up. Pt stated that she have drainage in the back of her throat that is causing her to cough at night. Pt requesting for Dr. Caryl Bis to prescribe her medication. Pt requesting callback

## 2021-05-18 NOTE — Telephone Encounter (Signed)
Sorry post nasal drip sorry for not clarifying.

## 2021-05-18 NOTE — Telephone Encounter (Signed)
Patient reporting sinus drainage not getting better from Appointment on 05/15/2021 that PCP had mentioned at visit would treat if did not continue to improve. Afebrile increased drainage patient cannot cough up but does cause night coughing that keeps patient awake.   PCP Note from 05/15/21 visit: Suspect viral illness or allergies.  She had 2 negative home COVID test.  She will monitor symptoms and if they do not continue to improve she will let us knowSuspect viral illness or allergies.

## 2021-05-18 NOTE — Telephone Encounter (Signed)
Does she have sinus congestion or just post nasal drip? This will help me figure out what to give her.

## 2021-05-22 NOTE — Telephone Encounter (Signed)
Patient aware and will call back if she does not continue to improve.

## 2021-06-06 ENCOUNTER — Telehealth: Payer: Self-pay | Admitting: Family Medicine

## 2021-06-06 NOTE — Telephone Encounter (Signed)
Pt called in stating that she been having diarrhea for about 2 weeks. Pt stated that she is taking OTC pepto bismol. Pt was wondering if she needs a appointment or could Dr. Caryl Bis prescribe her something. Pt stated she is not having any fevers, no stomachs, no headaches, and no nausea. Pt requesting callback ?

## 2021-06-06 NOTE — Telephone Encounter (Signed)
I called the patient and I scheduled her with Dr. Olivia Mackie Mclean-Scoccuzza tomorrow about her diarrhea for the past 2 weeks.  Gaius Ishaq,cma  ?

## 2021-06-07 ENCOUNTER — Other Ambulatory Visit: Payer: Self-pay

## 2021-06-07 ENCOUNTER — Ambulatory Visit (INDEPENDENT_AMBULATORY_CARE_PROVIDER_SITE_OTHER): Payer: Medicare Other | Admitting: Internal Medicine

## 2021-06-07 ENCOUNTER — Encounter: Payer: Self-pay | Admitting: Internal Medicine

## 2021-06-07 VITALS — BP 140/80 | HR 84 | Temp 98.2°F | Resp 14 | Ht 62.0 in | Wt 140.2 lb

## 2021-06-07 DIAGNOSIS — R197 Diarrhea, unspecified: Secondary | ICD-10-CM

## 2021-06-07 DIAGNOSIS — D0472 Carcinoma in situ of skin of left lower limb, including hip: Secondary | ICD-10-CM | POA: Diagnosis not present

## 2021-06-07 NOTE — Patient Instructions (Addendum)
Broth  ?Rice  ?Applesauce  ?Toast  ?Crackers  ?Oatmeal  ?Eggs  ?Bananas  ? ?Pre/probiotic over the counter like Align or Culturelle  ?Daily while having diarrhea  ? ?Diarrhea, Adult ?Diarrhea is frequent loose and watery bowel movements. Diarrhea can make you feel weak and cause you to become dehydrated. Dehydration can make you tired and thirsty, cause you to have a dry mouth, and decrease how often you urinate. ?Diarrhea typically lasts 2-3 days. However, it can last longer if it is a sign of something more serious. It is important to treat your diarrhea as told by your health care provider. ?Follow these instructions at home: ?Eating and drinking ?  ?Follow these recommendations as told by your health care provider: ?Take an oral rehydration solution (ORS). This is an over-the-counter medicine that helps return your body to its normal balance of nutrients and water. It is found at pharmacies and retail stores. ?Drink plenty of fluids, such as water, ice chips, diluted fruit juice, and low-calorie sports drinks. You can drink milk also, if desired. ?Avoid drinking fluids that contain a lot of sugar or caffeine, such as energy drinks, sports drinks, and soda. ?Eat bland, easy-to-digest foods in small amounts as you are able. These foods include bananas, applesauce, rice, lean meats, toast, and crackers. ?Avoid alcohol. ?Avoid spicy or fatty foods. ? ?Medicines ?Take over-the-counter and prescription medicines only as told by your health care provider. ?If you were prescribed an antibiotic medicine, take it as told by your health care provider. Do not stop using the antibiotic even if you start to feel better. ?General instructions ? ?Wash your hands often using soap and water. If soap and water are not available, use a hand sanitizer. Others in the household should wash their hands as well. Hands should be washed: ?After using the toilet or changing a diaper. ?Before preparing, cooking, or serving food. ?While  caring for a sick person or while visiting someone in a hospital. ?Drink enough fluid to keep your urine pale yellow. ?Rest at home while you recover. ?Watch your condition for any changes. ?Take a warm bath to relieve any burning or pain from frequent diarrhea episodes. ?Keep all follow-up visits as told by your health care provider. This is important. ?Contact a health care provider if: ?You have a fever. ?Your diarrhea gets worse. ?You have new symptoms. ?You cannot keep fluids down. ?You feel light-headed or dizzy. ?You have a headache. ?You have muscle cramps. ?Get help right away if: ?You have chest pain. ?You feel extremely weak or you faint. ?You have bloody or black stools or stools that look like tar. ?You have severe pain, cramping, or bloating in your abdomen. ?You have trouble breathing or you are breathing very quickly. ?Your heart is beating very quickly. ?Your skin feels cold and clammy. ?You feel confused. ?You have signs of dehydration, such as: ?Dark urine, very little urine, or no urine. ?Cracked lips. ?Dry mouth. ?Sunken eyes. ?Sleepiness. ?Weakness. ?Summary ?Diarrhea is frequent loose and sometimes watery bowel movements. Diarrhea can make you feel weak and cause you to become dehydrated. ?Drink enough fluids to keep your urine pale yellow. ?Make sure that you wash your hands after using the toilet. If soap and water are not available, use hand sanitizer. ?Contact a health care provider if your diarrhea gets worse or you have new symptoms. ?Get help right away if you have signs of dehydration. ?This information is not intended to replace advice given to you by your health  care provider. Make sure you discuss any questions you have with your health care provider. ?Document Revised: 09/21/2020 Document Reviewed: 09/21/2020 ?Elsevier Patient Education ? [place]. ? ?Bland Diet ?A bland diet consists of foods that are often soft and do not have a lot of fat, fiber, or extra seasonings.  Foods without fat, fiber, or seasoning are easier for the body to digest. They are also less likely to irritate your mouth, throat, stomach, and other parts of your digestive system. A bland diet is sometimes called a BRAT diet. ?What is my plan? ?Your health care provider or food and nutrition specialist (dietitian) may recommend specific changes to your diet to prevent symptoms or to treat your symptoms. These changes may include: ?Eating small meals often. ?Cooking food until it is soft enough to chew easily. ?Chewing your food well. ?Drinking fluids slowly. ?Not eating foods that are very spicy, sour, or fatty. ?Not eating citrus fruits, such as oranges and grapefruit. ?What do I need to know about this diet? ?Eat a variety of foods from the bland diet food list. ?Do not follow a bland diet longer than needed. ?Ask your health care provider whether you should take vitamins or supplements. ?What foods can I eat? ?Grains ?Hot cereals, such as cream of wheat. Rice. Bread, crackers, or tortillas made from refined white flour. ?Vegetables ?Canned or cooked vegetables. Mashed or boiled potatoes. ?Fruits ?Bananas. Applesauce. Other types of cooked or canned fruit with the skin and seeds removed, such as canned peaches or pears. ?Meats and other proteins ?Scrambled eggs. Creamy peanut butter or other nut butters. Lean, well-cooked meats, such as chicken or fish. Tofu. Soups or broths. ?Dairy ?Low-fat dairy products, such as milk, cottage cheese, or yogurt. ?Beverages ?Water. Herbal tea. Apple juice. ?Fats and oils ?Mild salad dressings. Canola or olive oil. ?Sweets and desserts ?Pudding. Custard. Fruit gelatin. Ice cream. ?The items listed above may not be a complete list of recommended foods and beverages. Contact a dietitian for more options. ?What foods are not recommended? ?Grains ?Whole grain breads and cereals. ?Vegetables ?Raw vegetables. ?Fruits ?Raw fruits, especially citrus, berries, or dried  fruits. ?Dairy ?Whole fat dairy foods. ?Beverages ?Caffeinated drinks. Alcohol. ?Seasonings and condiments ?Strongly flavored seasonings or condiments. Hot sauce. Salsa. ?Other foods ?Spicy foods. Fried foods. Sour foods, such as pickled or fermented foods. Foods with high sugar content. Foods high in fiber. ?The items listed above may not be a complete list of foods and beverages to avoid. Contact a dietitian for more information. ?Summary ?A bland diet consists of foods that are often soft and do not have a lot of fat, fiber, or extra seasonings. ?Foods without fat, fiber, or seasoning are easier for the body to digest. ?Check with your health care provider to see how long you should follow this diet plan. It is not meant to be followed for long periods. ?This information is not intended to replace advice given to you by your health care provider. Make sure you discuss any questions you have with your health care provider. ?Document Revised: 04/10/2017 Document Reviewed: 04/10/2017 ?Elsevier Patient Education ? [place]. ? ?

## 2021-06-07 NOTE — Progress Notes (Signed)
Chief Complaint  ?Patient presents with  ? Acute Visit  ?  Pt arrives to clinic c/o diarrhea x2 wks, denies any prior hx or other sx's. Has taken 2 [employer] brand Immodium yesterday 06/06/21 _0 . LBM 06/06/21  ? ?F/u  ?1 diarrhea x 1.5 to 2 weeks no ab pain or fever but up to 4 stools last yesterday at 4 pm no recent abx use but had uri recently and negative for covid,tried otc peptobismol, immodium 2 pills yesterday the 1st time and no stool today. She has cats and lives with a roommate whose mom lives in facility in [place] and had diarrhea he diarrhea is dark but she is on iron daily  ?Last colonoscopy in 2018 Dr. Jamal Collin normal ?She had ab cramping but resolved this happened with diarrhea ? ? ?Review of Systems  ?Constitutional:  Negative for weight loss.  ?HENT:  Negative for hearing loss.   ?Eyes:  Negative for blurred vision.  ?Respiratory:  Negative for shortness of breath.   ?Cardiovascular:  Negative for chest pain.  ?Gastrointestinal:  Positive for diarrhea. Negative for abdominal pain and blood in stool.  ?Genitourinary:  Negative for dysuria.  ?Musculoskeletal:  Negative for falls and joint pain.  ?Skin:  Negative for rash.  ?Neurological:  Negative for headaches.  ?Psychiatric/Behavioral:  Negative for depression.   ?Past Medical History:  ?Diagnosis Date  ? Hyperlipidemia   ? Hypertension   ? Thyroid disease   ? ?Past Surgical History:  ?Procedure Laterality Date  ? anal sphincterotomy and fissurectomy  2009  ? COLONOSCOPY  05/24/2011  ? Dr. Jill Side [facility] at [facility]  ? COLONOSCOPY  05/20/2000  ? Dr. Jamal Collin; Sharp bend at 50 cm. Scope could not be advanced any further even with glucagon.  ? COLONOSCOPY WITH PROPOFOL N/A 04/24/2016  ? Procedure: COLONOSCOPY WITH PROPOFOL;  Surgeon: Christene Lye, MD;  Location: [facility] ENDOSCOPY;  Service: Endoscopy;  Laterality: N/A;  ? EYE SURGERY Bilateral 2010  ? cataract surgery.   ? TONSILLECTOMY    ? ?Family History  ?Problem  Relation Age of Onset  ? Depression Mother   ? Heart disease Father   ? Early death Sister   ? Alcohol abuse Brother   ? Stroke Paternal Grandfather   ? ?Social History  ? ?Socioeconomic History  ? Marital status: Single  ?  Spouse name: Not on file  ? Number of children: Not on file  ? Years of education: Not on file  ? Highest education level: Not on file  ?Occupational History  ? Not on file  ?Tobacco Use  ? Smoking status: Former  ?  Types: Cigarettes  ? Smokeless tobacco: Never  ?Vaping Use  ? Vaping Use: Never used  ?Substance and Sexual Activity  ? Alcohol use: No  ? Drug use: No  ? Sexual activity: Never  ?Other Topics Concern  ? Not on file  ?Social History Narrative  ? Not on file  ? ?Social Determinants of Health  ? ?Financial Resource Strain: Low Risk   ? Difficulty of Paying Living Expenses: Not hard at all  ?Food Insecurity: No Food Insecurity  ? Worried About Charity fundraiser in the Last Year: Never true  ? Ran Out of Food in the Last Year: Never true  ?Transportation Needs: No Transportation Needs  ? Lack of Transportation (Medical): No  ? Lack of Transportation (Non-Medical): No  ?Physical Activity: Not on file  ?Stress: No Stress Concern Present  ? Feeling of  Stress : Not at all  ?Social Connections: Unknown  ? Frequency of Communication with Friends and Family: More than three times a week  ? Frequency of Social Gatherings with Friends and Family: More than three times a week  ? Attends Religious Services: Not on file  ? Active Member of Clubs or Organizations: Not on file  ? Attends Archivist Meetings: Not on file  ? Marital Status: Not on file  ?Intimate Partner Violence: Not At Risk  ? Fear of Current or Ex-Partner: No  ? Emotionally Abused: No  ? Physically Abused: No  ? Sexually Abused: No  ? ?Current Meds  ?Medication Sig  ? amoxicillin (AMOXIL) 875 MG tablet Take 1 tablet (875 mg total) by mouth 2 (two) times daily.  ? aspirin EC 81 MG tablet Take 81 mg by mouth daily. Taking  every other day.  ? cholecalciferol (VITAMIN D) 1000 units tablet Take 1,000 Units by mouth daily.  ? ferrous sulfate 325 (65 FE) MG tablet Take 325 mg by mouth daily with breakfast.  ? levothyroxine (SYNTHROID) 100 MCG tablet TAKE 1 TABLET BY MOUTH ONCE DAILY BEFORE BREAKFAST  ? lisinopril-hydrochlorothiazide (ZESTORETIC) 20-25 MG tablet Take 1 tablet by mouth once daily  ? rosuvastatin (CRESTOR) 40 MG tablet Take 1 tablet by mouth once daily  ? zolpidem (AMBIEN) 5 MG tablet TAKE 1 TABLET BY MOUTH AT BEDTIME AS NEEDED FOR SLEEP  ? ?Allergies  ?Allergen Reactions  ? Codeine Other (See Comments)  ?  nausea  ? ?Recent Results (from the past 2160 hour(s))  ?TSH     Status: None  ? Collection Time: 03/22/21  7:59 AM  ?Result Value Ref Range  ? TSH 1.81 0.35 - 5.50 uIU/mL  ?Comp Met (CMET)     Status: Abnormal  ? Collection Time: 05/15/21  3:10 PM  ?Result Value Ref Range  ? Sodium 137 135 - 145 mEq/L  ? Potassium 3.7 3.5 - 5.1 mEq/L  ? Chloride 101 96 - 112 mEq/L  ? CO2 32 19 - 32 mEq/L  ? Glucose, Bld 95 70 - 99 mg/dL  ? BUN 14 6 - 23 mg/dL  ? Creatinine, Ser 1.14 0.40 - 1.20 mg/dL  ? Total Bilirubin 0.5 0.2 - 1.2 mg/dL  ? Alkaline Phosphatase 77 39 - 117 U/L  ? AST 32 0 - 37 U/L  ? ALT 36 (H) 0 - 35 U/L  ? Total Protein 6.9 6.0 - 8.3 g/dL  ? Albumin 4.4 3.5 - 5.2 g/dL  ? GFR 47.43 (L) >60.00 mL/min  ?  Comment: Calculated using the CKD-EPI Creatinine Equation (2021)  ? Calcium 9.4 8.4 - 10.5 mg/dL  ?Lipid panel     Status: Abnormal  ? Collection Time: 05/15/21  3:10 PM  ?Result Value Ref Range  ? Cholesterol 161 0 - 200 mg/dL  ?  Comment: ATP III Classification       Desirable:  < 200 mg/dL               Borderline High:  200 - 239 mg/dL          High:  > = 240 mg/dL  ? Triglycerides 221.0 (H) 0.0 - 149.0 mg/dL  ?  Comment: Normal:  <150 mg/dLBorderline High:  150 - 199 mg/dL  ? HDL 53.60 >39.00 mg/dL  ? VLDL 44.2 (H) 0.0 - 40.0 mg/dL  ? Total CHOL/HDL Ratio 3   ?  Comment:  Men          Women1/2 Average  Risk     3.4          3.3Average Risk          5.0          4.42X Average Risk          9.6          7.13X Average Risk          15.0          11.0                      ? NonHDL 107.41   ?  Comment: NOTE:  Non-HDL goal should be 30 mg/dL higher than patient's LDL goal (i.e. LDL goal of < 70 mg/dL, would have non-HDL goal of < 100 mg/dL)  ?LDL cholesterol, direct     Status: None  ? Collection Time: 05/15/21  3:10 PM  ?Result Value Ref Range  ? Direct LDL 42.0 mg/dL  ?  Comment: Optimal:  <100 mg/dLNear or Above Optimal:  100-129 mg/dLBorderline High:  130-159 mg/dLHigh:  160-189 mg/dLVery High:  >190 mg/dL  ? ?Objective  ?Body mass index is 25.64 kg/m?. ?Wt Readings from Last 3 Encounters:  ?06/07/21 140 lb 3.2 oz (63.6 kg)  ?05/15/21 138 lb 3.2 oz (62.7 kg)  ?05/12/21 141 lb (64 kg)  ? ?Temp Readings from Last 3 Encounters:  ?06/07/21 98.2 ?F (36.8 ?C) (Oral)  ?05/15/21 98.7 ?F (37.1 ?C) (Oral)  ?08/10/20 98.5 ?F (36.9 ?C) (Oral)  ? ?BP Readings from Last 3 Encounters:  ?06/07/21 140/80  ?05/15/21 130/80  ?05/12/21 (!) 153/71  ? ?Pulse Readings from Last 3 Encounters:  ?06/07/21 84  ?05/15/21 72  ?05/12/21 79  ? ? ?Physical Exam ?Vitals and nursing note reviewed.  ?Constitutional:   ?   Appearance: Normal appearance. She is well-developed and well-groomed.  ?HENT:  ?   Head: Normocephalic and atraumatic.  ?Eyes:  ?   Conjunctiva/sclera: Conjunctivae normal.  ?   Pupils: Pupils are equal, round, and reactive to light.  ?Cardiovascular:  ?   Rate and Rhythm: Normal rate and regular rhythm.  ?   Heart sounds: Normal heart sounds. No murmur heard. ?Pulmonary:  ?   Effort: Pulmonary effort is normal.  ?   Breath sounds: Normal breath sounds.  ?Abdominal:  ?   General: Abdomen is flat. Bowel sounds are normal.  ?   Tenderness: There is no abdominal tenderness.  ?Musculoskeletal:     ?   General: No tenderness.  ?Skin: ?   General: Skin is warm and dry.  ?Neurological:  ?   General: No focal deficit present.  ?   Mental  Status: She is alert and oriented to person, place, and time. Mental status is at baseline.  ?   Cranial Nerves: Cranial nerves 2-12 are intact.  ?   Motor: Motor function is intact.  ?   Coordination: [place]

## 2021-06-09 ENCOUNTER — Other Ambulatory Visit
Admission: RE | Admit: 2021-06-09 | Discharge: 2021-06-09 | Disposition: A | Discharge status: Home / Self Care | Payer: Medicare Other | Source: Ambulatory Visit | Attending: Internal Medicine | Admitting: Internal Medicine

## 2021-06-09 DIAGNOSIS — R197 Diarrhea, unspecified: Secondary | ICD-10-CM | POA: Insufficient documentation

## 2021-06-09 LAB — C DIFFICILE QUICK SCREEN W PCR REFLEX
C Diff antigen: NEGATIVE
C Diff interpretation: NOT DETECTED
C Diff toxin: NEGATIVE

## 2021-06-13 LAB — OVA + PARASITE EXAM

## 2021-06-13 LAB — O&P RESULT

## 2021-07-13 ENCOUNTER — Other Ambulatory Visit: Payer: Self-pay | Admitting: Family

## 2021-07-13 DIAGNOSIS — G4700 Insomnia, unspecified: Secondary | ICD-10-CM

## 2021-07-17 ENCOUNTER — Other Ambulatory Visit: Payer: Self-pay | Admitting: Family

## 2021-07-17 DIAGNOSIS — G4700 Insomnia, unspecified: Secondary | ICD-10-CM

## 2021-07-17 NOTE — Telephone Encounter (Signed)
Pt called in requesting refill on medication (zolpidem (AMBIEN) 5 MG tablet)... Pt stated that pharmacy sent over a fax.. Pt requesting callback  ?

## 2021-07-18 NOTE — Telephone Encounter (Signed)
Patient called again about refilling her zolpidem (AMBIEN) 5 MG tablet. She is going out of town on Friday and needs this refilled. ?

## 2021-07-19 MED ORDER — ZOLPIDEM TARTRATE 5 MG PO TABS: 5.0000 mg | ORAL_TABLET | Freq: Every evening | ORAL | 0 refills | Status: DC | PRN

## 2021-07-19 NOTE — Addendum Note (Signed)
Addended by: Caryl Bis Shaquanna Lycan G on: 07/19/2021 12:43 PM ? ? Modules accepted: Orders ? ?

## 2021-07-19 NOTE — Telephone Encounter (Signed)
Pt is calling about previous message 

## 2021-07-19 NOTE — Telephone Encounter (Signed)
Sent to pharmacy 

## 2021-08-06 ENCOUNTER — Telehealth: Payer: Self-pay | Admitting: Family

## 2021-08-09 NOTE — Telephone Encounter (Signed)
Pt called in requesting refill on medication (levothyroxine (SYNTHROID) 100 MCG tablet).... Pt stated that the pharmacy sent over a fax... Pharmacy advised pt that they haven't heard anything back from our office... Pt requesting callback ?

## 2021-08-10 ENCOUNTER — Other Ambulatory Visit: Payer: Self-pay

## 2021-08-10 DIAGNOSIS — E039 Hypothyroidism, unspecified: Secondary | ICD-10-CM

## 2021-08-10 MED ORDER — LEVOTHYROXINE SODIUM 100 MCG PO TABS: 100.0000 ug | ORAL_TABLET | Freq: Every day | ORAL | 1 refills | Status: DC

## 2021-08-10 NOTE — Telephone Encounter (Signed)
I sent the medication to the pharmacy.  Renesmay Nesbitt,cma

## 2021-09-03 ENCOUNTER — Other Ambulatory Visit: Payer: Self-pay | Admitting: Family

## 2021-09-03 DIAGNOSIS — E785 Hyperlipidemia, unspecified: Secondary | ICD-10-CM

## 2021-09-06 NOTE — Telephone Encounter (Signed)
Pt need refill on rosuvastatin sent to [address]

## 2021-09-07 ENCOUNTER — Other Ambulatory Visit: Payer: Self-pay

## 2021-09-07 DIAGNOSIS — E785 Hyperlipidemia, unspecified: Secondary | ICD-10-CM

## 2021-09-07 MED ORDER — ROSUVASTATIN CALCIUM 40 MG PO TABS: 40.0000 mg | ORAL_TABLET | Freq: Every day | ORAL | 0 refills | Status: DC

## 2021-09-25 ENCOUNTER — Other Ambulatory Visit: Payer: Self-pay | Admitting: Family Medicine

## 2021-10-01 ENCOUNTER — Other Ambulatory Visit: Payer: Self-pay | Admitting: Family Medicine

## 2021-11-06 ENCOUNTER — Other Ambulatory Visit: Payer: Self-pay | Admitting: Family Medicine

## 2021-11-06 DIAGNOSIS — G4700 Insomnia, unspecified: Secondary | ICD-10-CM

## 2021-11-13 ENCOUNTER — Ambulatory Visit: Payer: Medicare Other | Admitting: Family Medicine

## 2021-11-22 ENCOUNTER — Encounter: Payer: Self-pay | Admitting: Family Medicine

## 2021-11-22 ENCOUNTER — Ambulatory Visit (INDEPENDENT_AMBULATORY_CARE_PROVIDER_SITE_OTHER): Payer: Medicare Other | Admitting: Family Medicine

## 2021-11-22 DIAGNOSIS — E039 Hypothyroidism, unspecified: Secondary | ICD-10-CM

## 2021-11-22 DIAGNOSIS — G479 Sleep disorder, unspecified: Secondary | ICD-10-CM

## 2021-11-22 DIAGNOSIS — I10 Essential (primary) hypertension: Secondary | ICD-10-CM

## 2021-11-22 DIAGNOSIS — E785 Hyperlipidemia, unspecified: Secondary | ICD-10-CM | POA: Diagnosis not present

## 2021-11-22 MED ORDER — LISINOPRIL-HYDROCHLOROTHIAZIDE 20-25 MG PO TABS: 1.0000 | ORAL_TABLET | Freq: Every day | ORAL | 3 refills | Status: DC

## 2021-11-22 MED ORDER — ROSUVASTATIN CALCIUM 40 MG PO TABS: 40.0000 mg | ORAL_TABLET | Freq: Every day | ORAL | 3 refills | Status: DC

## 2021-11-22 MED ORDER — LEVOTHYROXINE SODIUM 100 MCG PO TABS: 100.0000 ug | ORAL_TABLET | Freq: Every day | ORAL | 3 refills | Status: DC

## 2021-11-22 NOTE — Patient Instructions (Signed)
Nice to see you. We will check labs.  

## 2021-11-22 NOTE — Assessment & Plan Note (Signed)
Stable. Continue ambien 5 mg daily.

## 2021-11-22 NOTE — Assessment & Plan Note (Signed)
Check TSH. Continue synthroid 100 mcg daily.

## 2021-11-22 NOTE — Assessment & Plan Note (Signed)
Well controlled. She will continue lisinopril-HCTZ 20-25 mg daily. Check labs.

## 2021-11-22 NOTE — Progress Notes (Signed)
[name], MD Phone: [phone]  Tammy Dalton is a 76 y.o. female who presents today for f/u.  HYPERTENSION Disease Monitoring Home BP Monitoring not checking at home, though it is <140/80 when she donates blood Chest pain- no    Dyspnea- no Medications Compliance-  taking lisinopril/HCTZ.   Edema- no BMET    Component Value Date/Time   NA 137 05/15/2021 1510   K 3.7 05/15/2021 1510   CL 101 05/15/2021 1510   CO2 32 05/15/2021 1510   GLUCOSE 95 05/15/2021 1510   BUN 14 05/15/2021 1510   CREATININE 1.14 05/15/2021 1510   CREATININE 1.07 (H) 07/10/2019 1625   CALCIUM 9.4 05/15/2021 1510   Insomnia: 7 hours of sleep nightly with ambien. Has cut out screens the hour before bed.   HYPOTHYROIDISM Disease Monitoring Weight changes: no  Skin Changes: no Heat/Cold intolerance: no  Medication Monitoring Compliance:  taking synthroid   Last TSH:   Lab Results  Component Value Date   TSH 1.81 03/22/2021     Social History   Tobacco Use  Smoking Status Former   Types: Cigarettes  Smokeless Tobacco Never    Current Outpatient Medications on File Prior to Visit  Medication Sig Dispense Refill   aspirin EC 81 MG tablet Take 81 mg by mouth daily. Taking every other day.     cholecalciferol (VITAMIN D) 1000 units tablet Take 1,000 Units by mouth daily.     ferrous sulfate 325 (65 FE) MG tablet Take 325 mg by mouth daily with breakfast.     zolpidem (AMBIEN) 5 MG tablet TAKE 1 TABLET BY MOUTH AT BEDTIME AS NEEDED FOR SLEEP 90 tablet 0   No current facility-administered medications on file prior to visit.     ROS see history of present illness  Objective  Physical Exam Vitals:   11/22/21 1538  BP: 120/60  Pulse: 73  Temp: 98.3 F (36.8 C)  SpO2: 94%    BP Readings from Last 3 Encounters:  11/22/21 120/60  06/07/21 140/80  05/15/21 130/80   Wt Readings from Last 3 Encounters:  11/22/21 135 lb (61.2 kg)  06/07/21 140 lb 3.2 oz (63.6 kg)  05/15/21  138 lb 3.2 oz (62.7 kg)    Physical Exam Constitutional:      General: She is not in acute distress.    Appearance: She is not diaphoretic.  Cardiovascular:     Rate and Rhythm: Normal rate and regular rhythm.     Heart sounds: Normal heart sounds.  Pulmonary:     Effort: Pulmonary effort is normal.     Breath sounds: Normal breath sounds.  Skin:    General: Skin is warm and dry.  Neurological:     Mental Status: She is alert.      Assessment/Plan: Please see individual problem list.  Problem List Items Addressed This Visit     Hypertension (Chronic)    Well controlled. She will continue lisinopril-HCTZ 20-25 mg daily. Check labs.      Relevant Medications   lisinopril-hydrochlorothiazide (ZESTORETIC) 20-25 MG tablet   rosuvastatin (CRESTOR) 40 MG tablet   Other Relevant Orders   Basic Metabolic Panel (BMET)   Hypothyroidism (Chronic)    Check TSH. Continue synthroid 100 mcg daily.      Relevant Medications   levothyroxine (SYNTHROID) 100 MCG tablet   Other Relevant Orders   TSH   Sleeping difficulty (Chronic)    Stable. Continue ambien 5 mg daily.       Hyperlipidemia  Relevant Medications   lisinopril-hydrochlorothiazide (ZESTORETIC) 20-25 MG tablet   rosuvastatin (CRESTOR) 40 MG tablet     Health Maintenance: patient will call to schedule her mammogram with [facility]. She will let me know if they need an order for this. She will get her flu vaccine and updated covid booster in late September at her request.   Return in about 6 months (around 05/24/2022).   [name], MD [place]

## 2021-11-23 LAB — BASIC METABOLIC PANEL
BUN: 18 mg/dL (ref 6–23)
CO2: 28 mEq/L (ref 19–32)
Calcium: 9.2 mg/dL (ref 8.4–10.5)
Chloride: 102 mEq/L (ref 96–112)
Creatinine, Ser: 1.18 mg/dL (ref 0.40–1.20)
GFR: 45.34 mL/min — ABNORMAL LOW (ref >60.00)
Glucose, Bld: 78 mg/dL (ref 70–99)
Potassium: 3.7 mEq/L (ref 3.5–5.1)
Sodium: 140 mEq/L (ref 135–145)

## 2021-11-23 LAB — TSH: TSH: 1.3 u[IU]/mL (ref 0.35–5.50)

## 2022-01-10 ENCOUNTER — Telehealth: Payer: Self-pay

## 2022-01-10 NOTE — Telephone Encounter (Signed)
I called the patient and informed her that the provider stated that she should get the latest covid vaccine and she understood.  Adella Manolis,cma

## 2022-01-10 NOTE — Telephone Encounter (Signed)
I think the patient should get the updated COVID vaccination given her age.  She can get this at the pharmacy.

## 2022-01-10 NOTE — Telephone Encounter (Signed)
Patient states she wanted to get her covid vaccine and has heard conflicting information about whether or not she should get it because of her age and good health.  Patient states she would like to know what Dr. [name] recommends for her to do in this situation.  Patient states she got her flu and RSV vaccines about three weeks ago at [facility] and they should be faxing the documentation to [place].

## 2022-01-17 DIAGNOSIS — D2262 Melanocytic nevi of left upper limb, including shoulder: Secondary | ICD-10-CM | POA: Diagnosis not present

## 2022-01-17 DIAGNOSIS — L570 Actinic keratosis: Secondary | ICD-10-CM | POA: Diagnosis not present

## 2022-01-17 DIAGNOSIS — Z85828 Personal history of other malignant neoplasm of skin: Secondary | ICD-10-CM | POA: Diagnosis not present

## 2022-01-17 DIAGNOSIS — D225 Melanocytic nevi of trunk: Secondary | ICD-10-CM | POA: Diagnosis not present

## 2022-01-17 DIAGNOSIS — X32XXXA Exposure to sunlight, initial encounter: Secondary | ICD-10-CM | POA: Diagnosis not present

## 2022-01-17 DIAGNOSIS — D2261 Melanocytic nevi of right upper limb, including shoulder: Secondary | ICD-10-CM | POA: Diagnosis not present

## 2022-01-21 IMAGING — US US RENAL
1 series · 14 of 25 positions shown · non-contrast
Comparison: None available.

CLINICAL DATA: Initial evaluation for chronic kidney disease, stage
III A.

EXAM:
RENAL / URINARY TRACT ULTRASOUND COMPLETE

[Series 1: us renal · 14 of 42 slices shown]
[im 1/42]
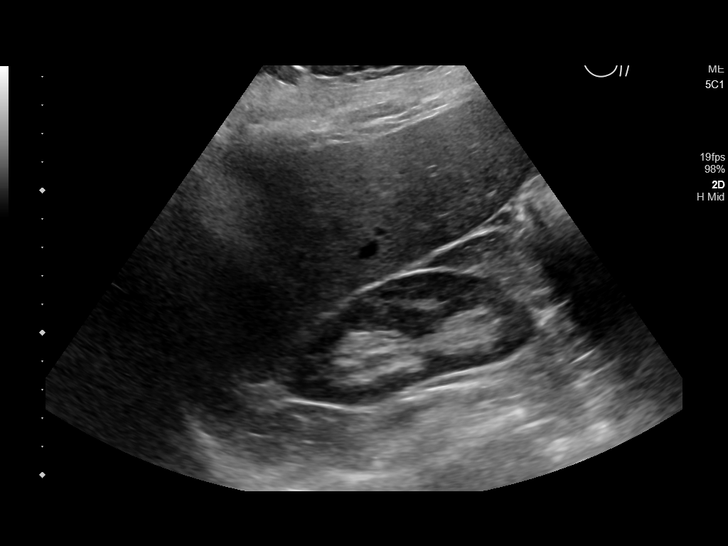
[im 4/42]
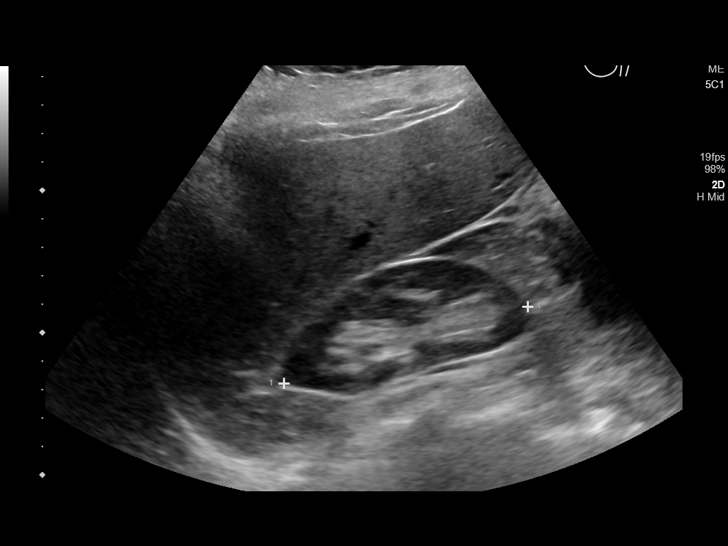
[im 7/42]
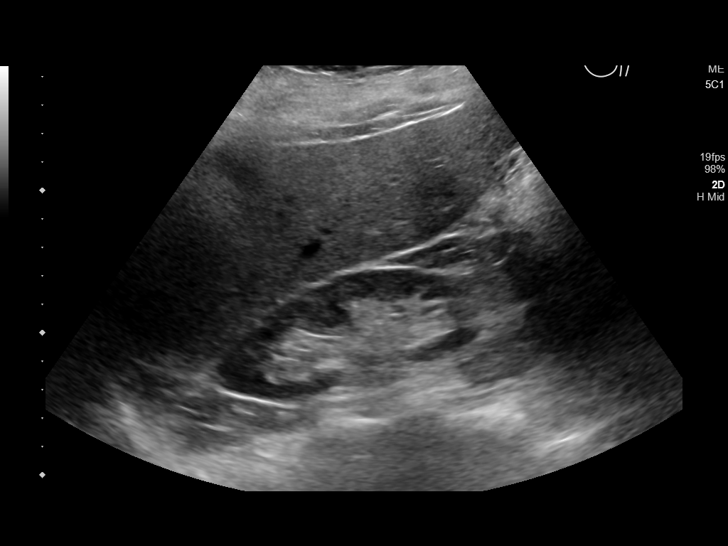
[im 11/42]
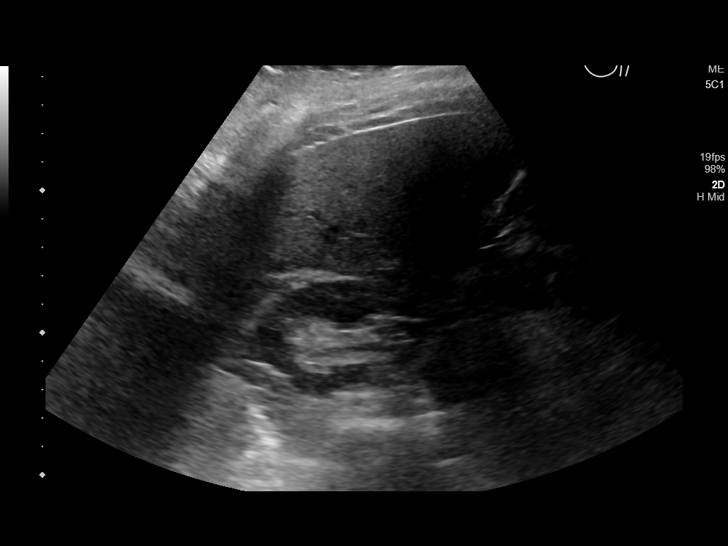
[im 14/42]
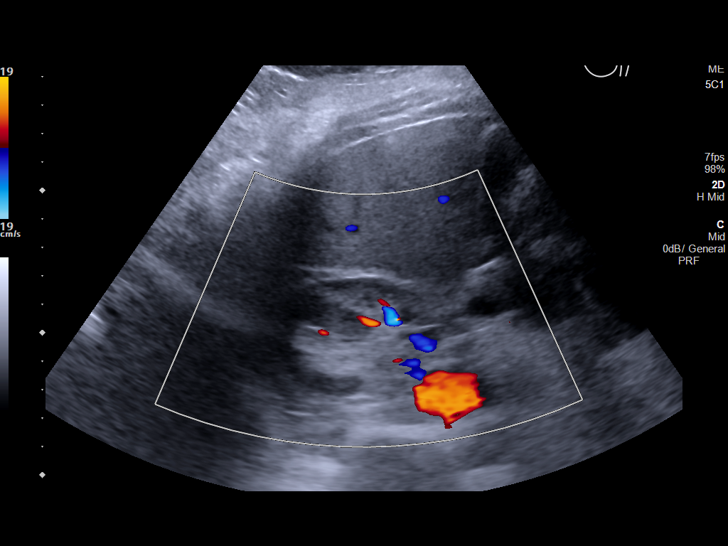
[im 16/42]
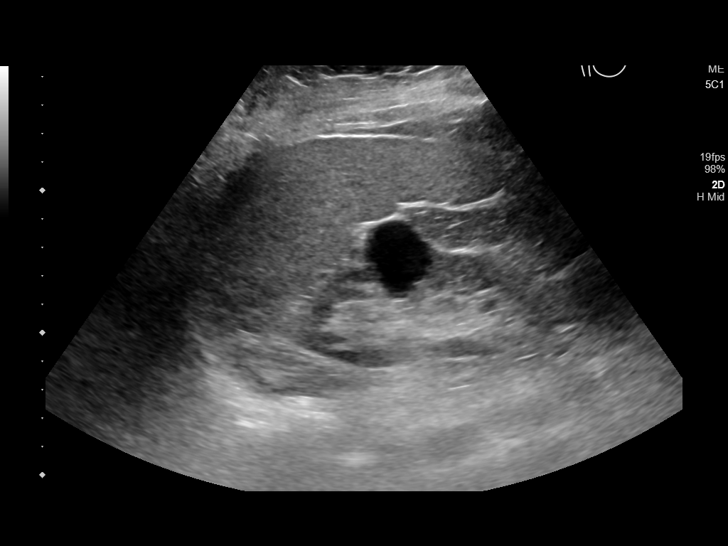
[im 19/42]
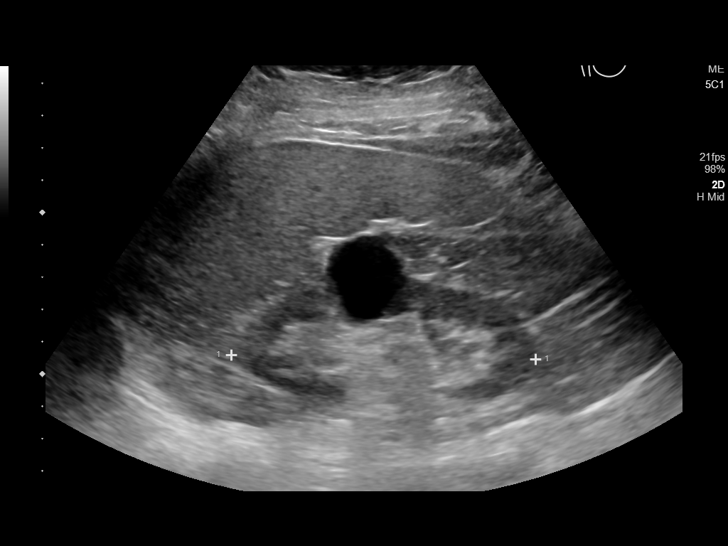
[im 23/42]
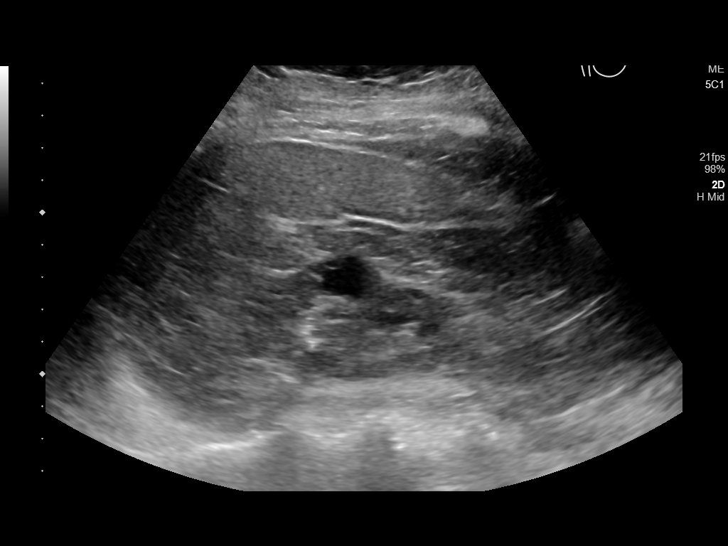
[im 26/42]
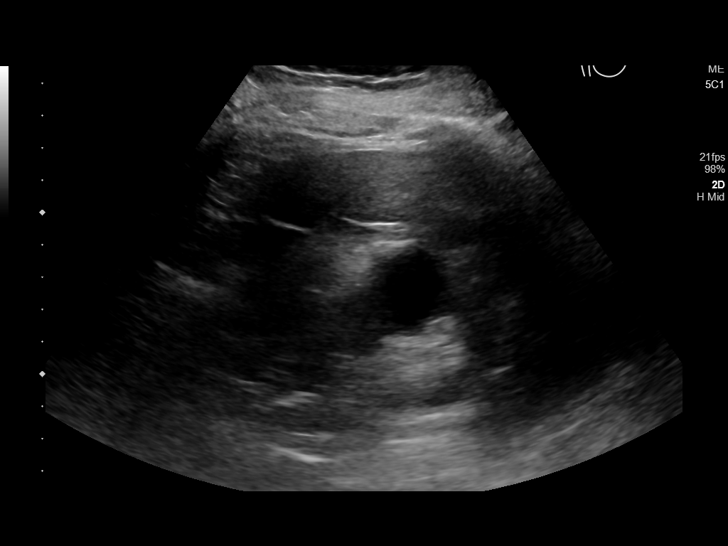
[im 28/42]
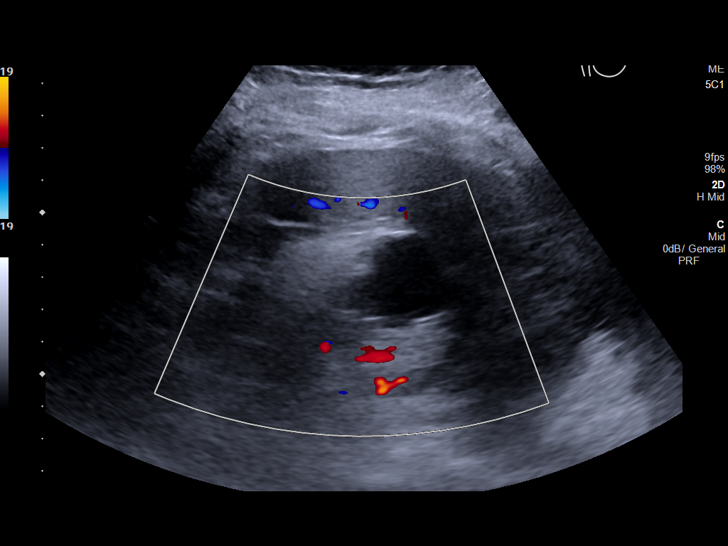
[im 31/42]
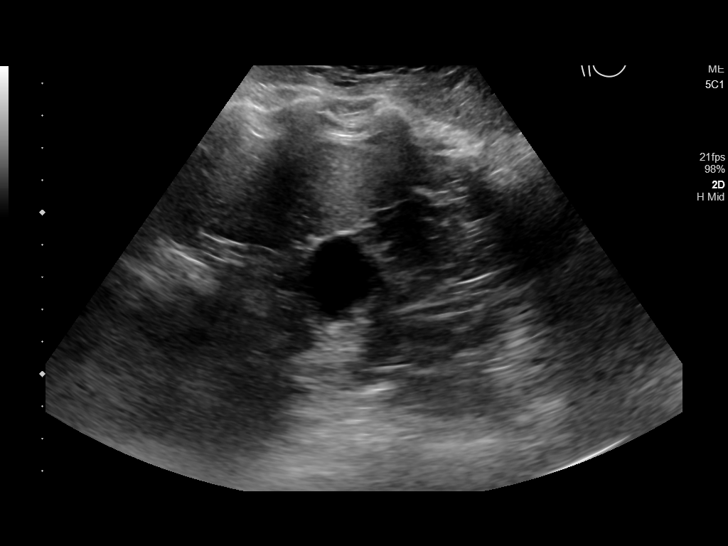
[im 35/42]
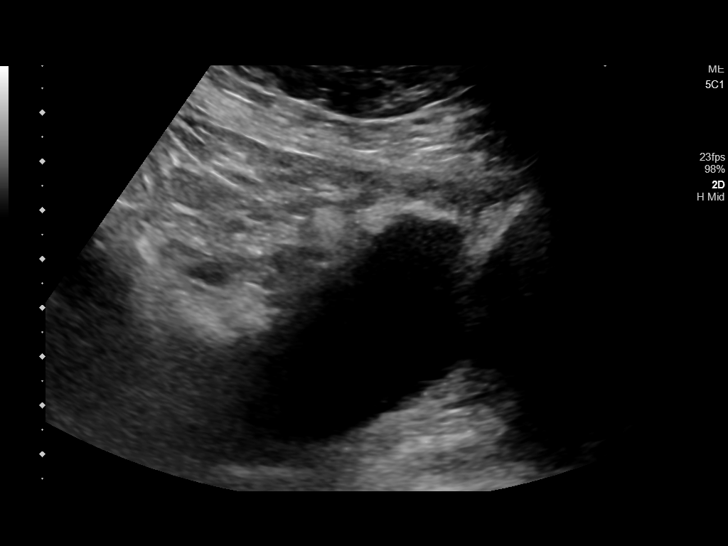
[im 38/42]
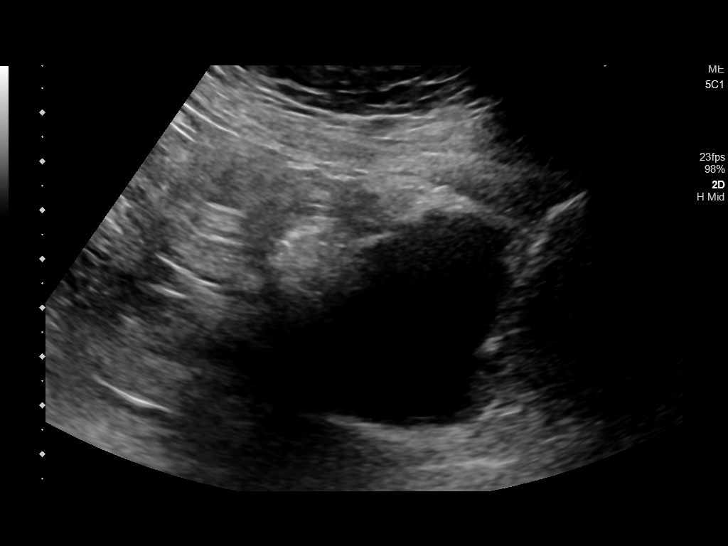
[im 42/42]
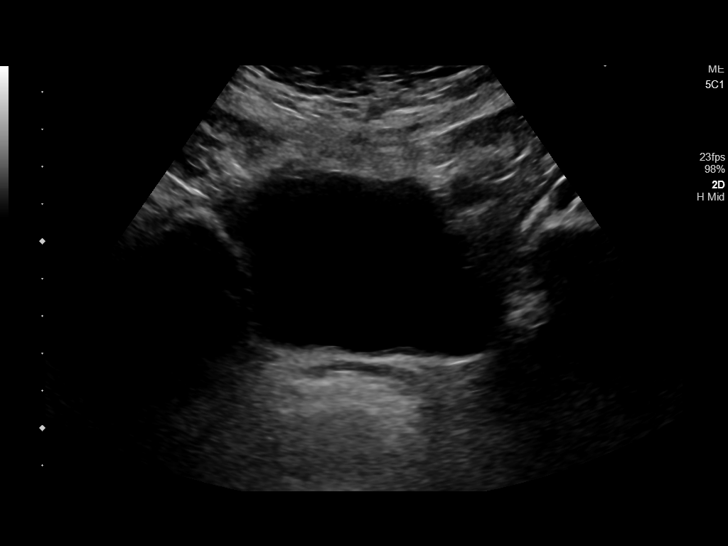

[14 of 25 positions shown; findings below may reference images not displayed]

FINDINGS: Right Kidney:

Renal measurements: 9.0 x 4.8 x 4.5 cm = volume: 101.5 mL.
Echogenicity within normal limits. No mass or hydronephrosis
visualized. No visible nephrolithiasis.

Left Kidney:

Renal measurements: 9.4 x 4.5 x 4.8 cm = volume: 105.1 mL.
Echogenicity within normal limits. No hydronephrosis. No visible
nephrolithiasis. 2.8 x 2.1 x 2.1 cm simple cyst present within the
interpolar region.

Bladder:

Appears normal for degree of bladder distention.

Other:

None.
IMPRESSION: 1. 2.8 cm simple cyst at the interpolar left kidney.
2. Otherwise normal renal ultrasound.  No hydronephrosis.

## 2022-02-04 ENCOUNTER — Other Ambulatory Visit: Payer: Self-pay | Admitting: Family Medicine

## 2022-02-04 DIAGNOSIS — G4700 Insomnia, unspecified: Secondary | ICD-10-CM

## 2022-02-05 DIAGNOSIS — Z1231 Encounter for screening mammogram for malignant neoplasm of breast: Secondary | ICD-10-CM | POA: Diagnosis not present

## 2022-02-05 LAB — HM MAMMOGRAPHY

## 2022-02-05 NOTE — Telephone Encounter (Signed)
Okay to refill?  LOV: 11/22/21 NOV: 05/28/22

## 2022-02-09 DIAGNOSIS — M5431 Sciatica, right side: Secondary | ICD-10-CM | POA: Diagnosis not present

## 2022-03-01 ENCOUNTER — Ambulatory Visit (LOCAL_COMMUNITY_HEALTH_CENTER): Payer: Medicare Other

## 2022-03-01 DIAGNOSIS — Z23 Encounter for immunization: Secondary | ICD-10-CM | POA: Diagnosis not present

## 2022-03-01 DIAGNOSIS — Z719 Counseling, unspecified: Secondary | ICD-10-CM

## 2022-03-01 NOTE — Progress Notes (Signed)
  Are you feeling sick today? No   Have you ever received a dose of COVID-19 Vaccine? [employer], [place], [place], [place], Other) Yes  If yes, which vaccine and how many doses?   PFIZER, 5   Did you bring the vaccination record card or other documentation?  Yes   Do you have a health condition or are undergoing treatment that makes you moderately or severely immunocompromised? This would include, but not be limited to: cancer, HIV, organ transplant, immunosuppressive therapy/high-dose corticosteroids, or moderate/severe primary immunodeficiency.  No  Have you received COVID-19 vaccine before or during hematopoietic cell transplant (HCT) or CAR-T-cell therapies? No  Have you ever had an allergic reaction to: (This would include a severe allergic reaction or a reaction that caused hives, swelling, or respiratory distress, including wheezing.) A component of a COVID-19 vaccine or a previous dose of COVID-19 vaccine? No   Have you ever had an allergic reaction to another vaccine (other thanCOVID-19 vaccine) or an injectable medication? (This would include a severe allergic reaction or a reaction that caused hives, swelling, or respiratory distress, including wheezing.)   No    Do you have a history of any of the following:  Myocarditis or Pericarditis No  Dermal fillers:  No  Multisystem Inflammatory Syndrome (MIS-C or MIS-A)? No  COVID-19 disease within the past 3 months? No  Vaccinated with monkeypox vaccine in the last 4 weeks? No  Eligible, administered [employer] [phone]. Monitored and tolerated well. M.Sukhman Martine, LPN.

## 2022-03-07 DIAGNOSIS — M13841 Other specified arthritis, right hand: Secondary | ICD-10-CM | POA: Diagnosis not present

## 2022-03-07 DIAGNOSIS — M79645 Pain in left finger(s): Secondary | ICD-10-CM | POA: Insufficient documentation

## 2022-03-07 DIAGNOSIS — M79644 Pain in right finger(s): Secondary | ICD-10-CM | POA: Insufficient documentation

## 2022-03-07 DIAGNOSIS — M13849 Other specified arthritis, unspecified hand: Secondary | ICD-10-CM | POA: Diagnosis not present

## 2022-03-08 DIAGNOSIS — M180 Bilateral primary osteoarthritis of first carpometacarpal joints: Secondary | ICD-10-CM | POA: Insufficient documentation

## 2022-03-08 DIAGNOSIS — M13849 Other specified arthritis, unspecified hand: Secondary | ICD-10-CM | POA: Insufficient documentation

## 2022-04-10 ENCOUNTER — Telehealth: Payer: Self-pay | Admitting: Family Medicine

## 2022-04-10 NOTE — Telephone Encounter (Signed)
Copied from [place] [phone] [id number]. Topic: Medicare AWV >> Apr 10, 2022 11:44 AM Devoria Glassing wrote: Reason for CRM: Left message for patient to schedule Annual Wellness Visit.  Please schedule with Nurse Health Advisor Madelyn Brunner, LPN at [facility]. This appt can be telephone or office visit.  Please call [phone] ask for [facility]

## 2022-04-18 ENCOUNTER — Telehealth: Payer: Self-pay | Admitting: Family Medicine

## 2022-04-18 ENCOUNTER — Encounter: Payer: Self-pay | Admitting: Family Medicine

## 2022-04-18 ENCOUNTER — Ambulatory Visit (INDEPENDENT_AMBULATORY_CARE_PROVIDER_SITE_OTHER): Payer: Medicare Other | Admitting: Family Medicine

## 2022-04-18 VITALS — BP 130/80 | HR 82 | Temp 98.5°F | Ht 62.0 in | Wt 139.8 lb

## 2022-04-18 DIAGNOSIS — R0981 Nasal congestion: Secondary | ICD-10-CM

## 2022-04-18 DIAGNOSIS — J069 Acute upper respiratory infection, unspecified: Secondary | ICD-10-CM

## 2022-04-18 LAB — POC COVID19 BINAXNOW: SARS Coronavirus 2 Ag: NEGATIVE

## 2022-04-18 MED ORDER — LORATADINE 10 MG PO TABS: 10.0000 mg | ORAL_TABLET | Freq: Every day | ORAL | 0 refills | Status: DC

## 2022-04-18 MED ORDER — FLUTICASONE PROPIONATE 50 MCG/ACT NA SUSP: 2.0000 | Freq: Every day | NASAL | 0 refills | Status: DC

## 2022-04-18 NOTE — Patient Instructions (Signed)
Nice to see you. I suspect you have a viral respiratory illness.  You can try Flonase and Claritin and see if that helps with your symptoms. You can try Tylenol to see if that will help with any discomfort.  If your symptoms are not starting to improve over the next 2 days please let us know and then we would consider antibiotics.

## 2022-04-18 NOTE — Telephone Encounter (Signed)
I called the patient and she is scheduled to be seen today for her issues.  Ashvin Adelson,cma

## 2022-04-18 NOTE — Assessment & Plan Note (Addendum)
Likely a viral illness on presentation today. Explained to patient that she should see improvement in her symptoms by the end of the week. Will send in Flonase spray and Claritin for symptoms. Advised patient to stop taking Sudafed if it is not helpful as this could increase her blood pressure. Also instructed patient to message [place] if she has no improvement or experiences worsening symptoms. If she contacts [place] with worsening symptoms will consider antibiotic therapy for possible sinusitis.

## 2022-04-18 NOTE — Progress Notes (Signed)
[name], MD Phone: [phone]  Tammy Dalton is a 77 y.o. female who presents today for same-day visit.  Sinus pain: patient reports that her symptoms began six days ago. She reports maxillary sinus aching, coughing, sore throat, and sinus pressure. She has yellow discharge from her nose but she states that she has blown her nose so much she has some minor bleeding from the nose as well. She denies fever, chills, chest pain, and shortness of breath. She has tried over-the-counter cold medicine and Sudafed with no relief.   Social History   Tobacco Use  Smoking Status Former   Types: Cigarettes  Smokeless Tobacco Never    Current Outpatient Medications on File Prior to Visit  Medication Sig Dispense Refill   aspirin EC 81 MG tablet Take 81 mg by mouth daily. Taking every other day.     cholecalciferol (VITAMIN D) 1000 units tablet Take 1,000 Units by mouth daily.     ferrous sulfate 325 (65 FE) MG tablet Take 325 mg by mouth daily with breakfast.     levothyroxine (SYNTHROID) 100 MCG tablet Take 1 tablet (100 mcg total) by mouth daily before breakfast. 90 tablet 3   lisinopril-hydrochlorothiazide (ZESTORETIC) 20-25 MG tablet Take 1 tablet by mouth daily. 90 tablet 3   meloxicam (MOBIC) 15 MG tablet Take 1 tablet by mouth once daily for 30 days     rosuvastatin (CRESTOR) 40 MG tablet Take 1 tablet (40 mg total) by mouth daily. 90 tablet 3   zolpidem (AMBIEN) 5 MG tablet TAKE 1 TABLET BY MOUTH AT BEDTIME AS NEEDED FOR SLEEP 90 tablet 0   No current facility-administered medications on file prior to visit.     ROS see history of present illness  Objective  Physical Exam Vitals:   04/18/22 1540  BP: 130/80  Pulse: 82  Temp: 98.5 F (36.9 C)  SpO2: 97%    BP Readings from Last 3 Encounters:  04/18/22 130/80  11/22/21 120/60  06/07/21 140/80   Wt Readings from Last 3 Encounters:  04/18/22 139 lb 12.8 oz (63.4 kg)  11/22/21 135 lb (61.2 kg)  06/07/21 140 lb  3.2 oz (63.6 kg)    Physical Exam Constitutional:      Appearance: Normal appearance.  HENT:     Head: Normocephalic and atraumatic.     Right Ear: Tympanic membrane normal.     Left Ear: Tympanic membrane normal.     Nose: Congestion and rhinorrhea present.     Right Sinus: Maxillary sinus tenderness present.     Left Sinus: Maxillary sinus tenderness present.     Mouth/Throat:     Mouth: Mucous membranes are moist.  Cardiovascular:     Rate and Rhythm: Normal rate and regular rhythm.  Pulmonary:     Effort: Pulmonary effort is normal.     Breath sounds: Normal breath sounds.  Lymphadenopathy:     Cervical: No cervical adenopathy.  Skin:    General: Skin is warm and dry.  Neurological:     Mental Status: She is alert.      Assessment/Plan: Please see individual problem list.  Problem List Items Addressed This Visit     Viral upper respiratory illness - Primary    Likely a viral illness on presentation today. Explained to patient that she should see improvement in her symptoms by the end of the week. Will send in Flonase spray and Claritin for symptoms. Advised patient to stop taking Sudafed if it is not helpful as  this could increase her blood pressure. Also instructed patient to message [place] if she has no improvement or experiences worsening symptoms. If she contacts [place] with worsening symptoms will consider antibiotic therapy for possible sinusitis.        Relevant Medications   fluticasone (FLONASE) 50 MCG/ACT nasal spray   loratadine (CLARITIN) 10 MG tablet   Other Visit Diagnoses     Sinus congestion       Relevant Orders   POC COVID-19 (Completed)        Return if symptoms worsen or fail to improve.   Marisa Cyphers, Medical Student [place]

## 2022-04-18 NOTE — Progress Notes (Signed)
Patient seen along with medical student zada Lowella Curb.  I personally evaluated this patient along with the student, and verified all aspects of the history, physical exam, and medical decision making as documented by the student.  I agree with the student's documentation and have made all necessary edits.  [name], MD

## 2022-04-18 NOTE — Telephone Encounter (Signed)
Pt called in staying that she spoke with a nurse over the phone this morning because she's having some sinus infection, headache, musus, and as per nurse she was told to call her provider so she can have some antibiotics or something for this symptoms to go away. She's available [phone].

## 2022-04-27 ENCOUNTER — Ambulatory Visit (INDEPENDENT_AMBULATORY_CARE_PROVIDER_SITE_OTHER): Payer: Medicare Other

## 2022-04-27 VITALS — Ht 62.0 in | Wt 139.0 lb

## 2022-04-27 DIAGNOSIS — Z0000 Encounter for general adult medical examination without abnormal findings: Secondary | ICD-10-CM

## 2022-04-27 NOTE — Patient Instructions (Addendum)
Tammy Dalton , Thank you for taking time to come for your Medicare Wellness Visit. I appreciate your ongoing commitment to your health goals. Please review the following plan we discussed and let me know if I can assist you in the future.   These are the goals we discussed:  Goals Addressed               This Visit's Progress     Patient Stated     Maintain healthy lifestyle (pt-stated)        Stay active Healthy diet Stay hydrated         This is a list of the screening recommended for you and due dates:  Health Maintenance  Topic Date Due   COVID-19 Vaccine (7 - 2023-24 season) 05/13/2022*   Medicare Annual Wellness Visit  04/28/2023   Colon Cancer Screening  04/24/2026   DTaP/Tdap/Td vaccine (3 - Td or Tdap) 04/14/2027   Pneumonia Vaccine  Completed   Flu Shot  Completed   DEXA scan (bone density measurement)  Completed   Hepatitis C Screening: USPSTF Recommendation to screen - Ages 89-79 yo.  Completed   Zoster (Shingles) Vaccine  Completed   HPV Vaccine  Aged Out  *Topic was postponed. The date shown is not the original due date.    Advanced directives: End of life planning; Advance aging; Advanced directives discussed.  Copy of current HCPOA/Living Will requested.    Conditions/risks identified: none new  Next appointment: Follow up in one year for your annual wellness visit    Preventive Care 65 Years and Older, Female Preventive care refers to lifestyle choices and visits with your health care provider that can promote health and wellness. What does preventive care include? A yearly physical exam. This is also called an annual well check. Dental exams once or twice a year. Routine eye exams. Ask your health care provider how often you should have your eyes checked. Personal lifestyle choices, including: Daily care of your teeth and gums. Regular physical activity. Eating a healthy diet. Avoiding tobacco and drug use. Limiting alcohol use. Practicing safe  sex. Taking low-dose aspirin every day. Taking vitamin and mineral supplements as recommended by your health care provider. What happens during an annual well check? The services and screenings done by your health care provider during your annual well check will depend on your age, overall health, lifestyle risk factors, and family history of disease. Counseling  Your health care provider may ask you questions about your: Alcohol use. Tobacco use. Drug use. Emotional well-being. Home and relationship well-being. Sexual activity. Eating habits. History of falls. Memory and ability to understand (cognition). Work and work Statistician. Reproductive health. Screening  You may have the following tests or measurements: Height, weight, and BMI. Blood pressure. Lipid and cholesterol levels. These may be checked every 5 years, or more frequently if you are over 73 years old. Skin check. Lung cancer screening. You may have this screening every year starting at age 80 if you have a 30-pack-year history of smoking and currently smoke or have quit within the past 15 years. Fecal occult blood test (FOBT) of the stool. You may have this test every year starting at age 71. Flexible sigmoidoscopy or colonoscopy. You may have a sigmoidoscopy every 5 years or a colonoscopy every 10 years starting at age 43. Hepatitis C blood test. Hepatitis B blood test. Sexually transmitted disease (STD) testing. Diabetes screening. This is done by checking your blood sugar (glucose) after you have  not eaten for a while (fasting). You may have this done every 1-3 years. Bone density scan. This is done to screen for osteoporosis. You may have this done starting at age 60. Mammogram. This may be done every 1-2 years. Talk to your health care provider about how often you should have regular mammograms. Talk with your health care provider about your test results, treatment options, and if necessary, the need for more  tests. Vaccines  Your health care provider may recommend certain vaccines, such as: Influenza vaccine. This is recommended every year. Tetanus, diphtheria, and acellular pertussis (Tdap, Td) vaccine. You may need a Td booster every 10 years. Zoster vaccine. You may need this after age 52. Pneumococcal 13-valent conjugate (PCV13) vaccine. One dose is recommended after age 57. Pneumococcal polysaccharide (PPSV23) vaccine. One dose is recommended after age 61. Talk to your health care provider about which screenings and vaccines you need and how often you need them. This information is not intended to replace advice given to you by your health care provider. Make sure you discuss any questions you have with your health care provider. Document Released: 04/08/2015 Document Revised: 11/30/2015 Document Reviewed: 01/11/2015 Elsevier Interactive Patient Education  2017 [employer] Prevention in the Home Falls can cause injuries. They can happen to people of all ages. There are many things you can do to make your home safe and to help prevent falls. What can I do on the outside of my home? Regularly fix the edges of walkways and driveways and fix any cracks. Remove anything that might make you trip as you walk through a door, such as a raised step or threshold. Trim any bushes or trees on the path to your home. Use bright outdoor lighting. Clear any walking paths of anything that might make someone trip, such as rocks or tools. Regularly check to see if handrails are loose or broken. Make sure that both sides of any steps have handrails. Any raised decks and porches should have guardrails on the edges. Have any leaves, snow, or ice cleared regularly. Use sand or salt on walking paths during winter. Clean up any spills in your garage right away. This includes oil or grease spills. What can I do in the bathroom? Use night lights. Install grab bars by the toilet and in the tub and shower.  Do not use towel bars as grab bars. Use non-skid mats or decals in the tub or shower. If you need to sit down in the shower, use a plastic, non-slip stool. Keep the floor dry. Clean up any water that spills on the floor as soon as it happens. Remove soap buildup in the tub or shower regularly. Attach bath mats securely with double-sided non-slip rug tape. Do not have throw rugs and other things on the floor that can make you trip. What can I do in the bedroom? Use night lights. Make sure that you have a light by your bed that is easy to reach. Do not use any sheets or blankets that are too big for your bed. They should not hang down onto the floor. Have a firm chair that has side arms. You can use this for support while you get dressed. Do not have throw rugs and other things on the floor that can make you trip. What can I do in the kitchen? Clean up any spills right away. Avoid walking on wet floors. Keep items that you use a lot in easy-to-reach places. If you need to  reach something above you, use a strong step stool that has a grab bar. Keep electrical cords out of the way. Do not use floor polish or wax that makes floors slippery. If you must use wax, use non-skid floor wax. Do not have throw rugs and other things on the floor that can make you trip. What can I do with my stairs? Do not leave any items on the stairs. Make sure that there are handrails on both sides of the stairs and use them. Fix handrails that are broken or loose. Make sure that handrails are as long as the stairways. Check any carpeting to make sure that it is firmly attached to the stairs. Fix any carpet that is loose or worn. Avoid having throw rugs at the top or bottom of the stairs. If you do have throw rugs, attach them to the floor with carpet tape. Make sure that you have a light switch at the top of the stairs and the bottom of the stairs. If you do not have them, ask someone to add them for you. What else  can I do to help prevent falls? Wear shoes that: Do not have high heels. Have rubber bottoms. Are comfortable and fit you well. Are closed at the toe. Do not wear sandals. If you use a stepladder: Make sure that it is fully opened. Do not climb a closed stepladder. Make sure that both sides of the stepladder are locked into place. Ask someone to hold it for you, if possible. Clearly mark and make sure that you can see: Any grab bars or handrails. First and last steps. Where the edge of each step is. Use tools that help you move around (mobility aids) if they are needed. These include: Canes. Walkers. Scooters. Crutches. Turn on the lights when you go into a dark area. Replace any light bulbs as soon as they burn out. Set up your furniture so you have a clear path. Avoid moving your furniture around. If any of your floors are uneven, fix them. If there are any pets around you, be aware of where they are. Review your medicines with your doctor. Some medicines can make you feel dizzy. This can increase your chance of falling. Ask your doctor what other things that you can do to help prevent falls. This information is not intended to replace advice given to you by your health care provider. Make sure you discuss any questions you have with your health care provider. Document Released: 01/06/2009 Document Revised: 08/18/2015 Document Reviewed: 04/16/2014 Elsevier Interactive Patient Education  2017 [employer].

## 2022-04-27 NOTE — Progress Notes (Signed)
Subjective:   Tammy Dalton is a 77 y.o. female who presents for Medicare Annual (Subsequent) preventive examination.  Review of Systems    No ROS.  Medicare Wellness Virtual Visit.  Visual/audio telehealth visit, UTA vital signs.   See social history for additional risk factors.   Cardiac Risk Factors include: advanced age (>59mn, >>48women);hypertension     Objective:    Today's Vitals   04/27/22 1104  Weight: 139 lb (63 kg)  Height: '5\' 2"'$  (1.575 m)   Body mass index is 25.42 kg/m.     04/27/2022   11:09 AM 04/14/2021   11:23 AM 04/07/2020   11:37 AM 04/02/2019   11:41 AM 01/13/2018    3:24 PM 04/13/2017    3:20 PM 12/24/2016    4:28 PM  Advanced Directives  Does Patient Have a Medical Advance Directive? Yes Yes Yes Yes Yes No Yes  Type of AParamed[place]of [place] will [place]Living will [place]Living will [place]Living will [place]Living will  [place] will  Does patient want to make changes to medical advance directive? No - Patient declined No - Patient declined No - Patient declined No - Patient declined No - Patient declined  No - Patient declined  Copy of [place] Chart? No - copy requested No - copy requested No - copy requested No - copy requested No - copy requested  No - copy requested  Would patient like information on creating a medical advance directive?      No - Patient declined     Current Medications (verified) Outpatient Encounter Medications as of 04/27/2022  Medication Sig   aspirin EC 81 MG tablet Take 81 mg by mouth daily. Taking every other day.   cholecalciferol (VITAMIN D) 1000 units tablet Take 1,000 Units by mouth daily.   ferrous sulfate 325 (65 FE) MG tablet Take 325 mg by mouth daily with breakfast.   fluticasone (FLONASE) 50 MCG/ACT nasal spray Place 2 sprays into both nostrils  daily.   levothyroxine (SYNTHROID) 100 MCG tablet Take 1 tablet (100 mcg total) by mouth daily before breakfast.   lisinopril-hydrochlorothiazide (ZESTORETIC) 20-25 MG tablet Take 1 tablet by mouth daily.   loratadine (CLARITIN) 10 MG tablet Take 1 tablet (10 mg total) by mouth daily.   meloxicam (MOBIC) 15 MG tablet Take 1 tablet by mouth once daily for 30 days   rosuvastatin (CRESTOR) 40 MG tablet Take 1 tablet (40 mg total) by mouth daily.   zolpidem (AMBIEN) 5 MG tablet TAKE 1 TABLET BY MOUTH AT BEDTIME AS NEEDED FOR SLEEP   No facility-administered encounter medications on file as of 04/27/2022.    Allergies (verified) Codeine   History: Past Medical History:  Diagnosis Date   Hyperlipidemia    Hypertension    Thyroid disease    Past Surgical History:  Procedure Laterality Date   anal sphincterotomy and fissurectomy  2009   COLONOSCOPY  05/24/2011   [place]   Dr. SJamal Collin Sharp bend at 50 cm. Scope could not be advanced any further even with glucagon.   COLONOSCOPY WITH PROPOFOL N/A 04/24/2016   Procedure: COLONOSCOPY WITH PROPOFOL;  Surgeon: SChristene Lye MD;  Location: [facility] ENDOSCOPY;  Service: Endoscopy;  Laterality: N/A;   EYE SURGERY Bilateral 2010   cataract surgery.    TONSILLECTOMY  Family History  Problem Relation Age of Onset   Depression Mother    Heart disease Father    Early death Sister    Alcohol abuse Brother    Stroke Paternal Grandfather    Social History   Socioeconomic History   Marital status: Single    Spouse name: Not on file   Number of children: Not on file   Years of education: Not on file   Highest education level: Not on file  Occupational History   Not on file  Tobacco Use   Smoking status: Former    Types: Cigarettes   Smokeless tobacco: Never  Vaping Use   Vaping Use: Never used  Substance and Sexual Activity   Alcohol use: No   Drug  use: No   Sexual activity: Never  Other Topics Concern   Not on file  Social History Narrative   Not on file   Social Determinants of Health   Financial Resource Strain: Low Risk  (04/27/2022)   Overall Financial Resource Strain (CARDIA)    Difficulty of Paying Living Expenses: Not hard at all  Food Insecurity: No Food Insecurity (04/27/2022)   Hunger Vital Sign    Worried About Running Out of Food in the Last Year: Never true    [place] in the Last Year: Never true  Transportation Needs: No Transportation Needs (04/27/2022)   PRAPARE - Hydrologist (Medical): No    Lack of Transportation (Non-Medical): No  Physical Activity: Sufficiently Active (04/27/2022)   Exercise Vital Sign    Days of Exercise per Week: 3 days    Minutes of Exercise per Session: 60 min  Stress: No Stress Concern Present (04/27/2022)   [employer] [place]    Feeling of Stress : Not at all  Social Connections: Unknown (04/27/2022)   Social Connection and Isolation Panel [NHANES]    Frequency of Communication with Friends and Family: More than three times a week    Frequency of Social Gatherings with Friends and Family: More than three times a week    Attends Religious Services: Not on Advertising copywriter or Organizations: Not on file    Attends Archivist Meetings: Not on file    Marital Status: Not on file    Tobacco Counseling Counseling given: Not Answered   Clinical Intake:  Pre-visit preparation completed: Yes        Diabetes: No  How often do you need to have someone help you when you read instructions, pamphlets, or other written materials from your doctor or pharmacy?: 1 - Never   Interpreter Needed?: No      Activities of Daily Living    04/27/2022   11:06 AM  In your present state of health, do you have any difficulty performing the following activities:  Hearing? 0   Vision? 0  Difficulty concentrating or making decisions? 0  Walking or climbing stairs? 0  Dressing or bathing? 0  Doing errands, shopping? 0  Preparing Food and eating ? N  Using the Toilet? N  In the past six months, have you accidently leaked urine? N  Do you have problems with loss of bowel control? N  Managing your Medications? N  Managing your Finances? N  Housekeeping or managing your Housekeeping? N    Patient Care Team: Leone Haven, MD as PCP - General (Family Medicine) Christene Lye, MD (General Surgery)  Indicate any recent Medical Services you may have received from other than [facility] providers in the past year (date may be approximate).     Assessment:   This is a routine wellness examination for Freeman.  I connected with  MARIPOSA SHORES on 04/27/22 by a audio enabled telemedicine application and verified that I am speaking with the correct person using two identifiers.  Patient Location: Home  Provider Location: Office/Clinic  I discussed the limitations of evaluation and management by telemedicine. The patient expressed understanding and agreed to proceed.   Hearing/Vision screen Hearing Screening - Comments:: Patient is able to hear conversational tones without difficulty.  No issues reported.   Vision Screening - Comments:: [facility] (Dr. Jeni Salles)  Wears lenses when reading  Cataract extraction, bilateral Visual acuity not assessed per patient preference since they have regular follow up with the ophthalmologist  Dietary issues and exercise activities discussed: Current Exercise Habits: Home exercise routine, Type of exercise: walking (golf, softball), Time (Minutes): 60, Frequency (Times/Week): 3, Weekly Exercise (Minutes/Week): 180, Intensity: Mild   Goals Addressed               This Visit's Progress     Patient Stated     Maintain healthy lifestyle (pt-stated)        Stay active Healthy diet Stay hydrated        Depression Screen    04/27/2022   11:08 AM 11/22/2021    3:39 PM 06/07/2021    2:08 PM 04/14/2021   11:23 AM 04/07/2020   11:41 AM 02/03/2020    3:40 PM 08/03/2019    3:42 PM  PHQ 2/9 Scores  PHQ - 2 Score 0 0 1 0 0 0 0    Fall Risk    04/27/2022   11:09 AM 11/22/2021    3:39 PM 06/07/2021    2:08 PM 04/14/2021   11:23 AM 04/07/2020   11:41 AM  Fall Risk   Falls in the past year? 0 0 1 0 0  Number falls in past yr: 0 0 0 0 0  Injury with Fall? 0 0 1  0  Risk for fall due to :  Orthopedic patient;No Fall Risks History of fall(s)    Follow up Falls evaluation completed;Falls prevention discussed Falls evaluation completed Falls evaluation completed Falls evaluation completed Falls evaluation completed    FALL RISK PREVENTION PERTAINING TO THE HOME: Home free of loose throw rugs in walkways, pet beds, electrical cords, etc? Yes  Adequate lighting in your home to reduce risk of falls? Yes   ASSISTIVE DEVICES UTILIZED TO PREVENT FALLS: Life alert? No  Use of a cane, walker or w/c? No  Grab bars in the bathroom? No  Shower chair or bench in shower? No  Elevated toilet seat or a handicapped toilet? No   TIMED UP AND GO: Was the test performed? No .   Cognitive Function:    01/13/2018    3:25 PM 12/24/2016    4:34 PM  MMSE - Mini Mental State Exam  Orientation to time 5 5  Orientation to Place 5 5  Registration 3 3  Attention/ Calculation 5 5  Recall 3 3  Language- name 2 objects 2 2  Language- repeat 1 1  Language- follow 3 step command 3 3  Language- read & follow direction 1 1  Write a sentence 1 1  Copy design 1 1  Total score 30 30        04/27/2022  11:30 AM  6CIT Screen  What Year? 0 points  What month? 0 points  What time? 0 points  Count back from 20 0 points  Months in reverse 0 points  Repeat phrase 0 points  Total Score 0 points    Immunizations Immunization History  Administered Date(s) Administered   COVID-19, mRNA, vaccine(Comirnaty)12  years and older 03/01/2022   Influenza Nasal 12/16/2020   Influenza Split 12/04/2021   Influenza, High Dose Seasonal PF 12/24/2016, 12/02/2017, 12/15/2018   Influenza-Unspecified 11/25/2015, 12/02/2017, 12/18/2019   PFIZER(Purple Top)SARS-COV-2 Vaccination 05/01/2019, 05/22/2019, 01/07/2020, 08/12/2020   Pfizer Covid-19 Vaccine Bivalent Booster 36yr & up 01/03/2021   Pneumococcal Conjugate-13 06/28/2017   Pneumococcal Polysaccharide-23 06/30/2018   Pneumococcal-Unspecified 05/25/2011   Rsv, Bivalent, Protein Subunit Rsvpref,pf (Evans Lance 12/04/2021   Tdap 08/01/2015, 04/13/2017   Zoster Recombinat (Shingrix) 06/13/2017, 09/13/2017   Zoster, Live 03/27/2007   Screening Tests Health Maintenance  Topic Date Due   COVID-19 Vaccine (7 - 2023-24 season) 05/13/2022 (Originally 04/26/2022)   Medicare Annual Wellness (AWV)  04/28/2023   COLONOSCOPY (Pts 45-485yrInsurance coverage will need to be confirmed)  04/24/2026   DTaP/Tdap/Td (3 - Td or Tdap) 04/14/2027   Pneumonia Vaccine 6570Years old  Completed   INFLUENZA VACCINE  Completed   DEXA SCAN  Completed   Hepatitis C Screening  Completed   Zoster Vaccines- Shingrix  Completed   HPV VACCINES  Aged Out   Health Maintenance There are no preventive care reminders to display for this patient.  Lung Cancer Screening: (Low Dose CT Chest recommended if Age 76-80ears, 30 pack-year currently smoking OR have quit w/in 15years.) does not qualify.   Hepatitis C Screening: Completed 06/2016.  Vision Screening: Recommended annual ophthalmology exams for early detection of glaucoma and other disorders of the eye.  Dental Screening: Recommended annual dental exams for proper oral hygiene  Community Resource Referral / Chronic Care Management: CRR required this visit?  No   CCM required this visit?  No      Plan:     I have personally reviewed and noted the following in the patient's chart:   Medical and social history Use of alcohol,  tobacco or illicit drugs  Current medications and supplements including opioid prescriptions. Patient is not currently taking opioid prescriptions. Functional ability and status Nutritional status Physical activity Advanced directives List of other physicians Hospitalizations, surgeries, and ER visits in previous 12 months Vitals Screenings to include cognitive, depression, and falls Referrals and appointments  In addition, I have reviewed and discussed with patient certain preventive protocols, quality metrics, and best practice recommendations. A written personalized care plan for preventive services as well as general preventive health recommendations were provided to patient.     DeLeta JunglingLPN   2/10/25/4233

## 2022-05-07 DIAGNOSIS — M546 Pain in thoracic spine: Secondary | ICD-10-CM | POA: Insufficient documentation

## 2022-05-20 ENCOUNTER — Other Ambulatory Visit: Payer: Self-pay | Admitting: Family Medicine

## 2022-05-20 DIAGNOSIS — G4700 Insomnia, unspecified: Secondary | ICD-10-CM

## 2022-05-28 ENCOUNTER — Encounter: Payer: Self-pay | Admitting: Family Medicine

## 2022-05-28 ENCOUNTER — Ambulatory Visit (INDEPENDENT_AMBULATORY_CARE_PROVIDER_SITE_OTHER): Payer: Medicare Other | Admitting: Family Medicine

## 2022-05-28 VITALS — BP 136/82 | HR 72 | Temp 97.6°F | Ht 62.0 in | Wt 139.6 lb

## 2022-05-28 DIAGNOSIS — M1990 Unspecified osteoarthritis, unspecified site: Secondary | ICD-10-CM

## 2022-05-28 DIAGNOSIS — E039 Hypothyroidism, unspecified: Secondary | ICD-10-CM

## 2022-05-28 DIAGNOSIS — I10 Essential (primary) hypertension: Secondary | ICD-10-CM | POA: Diagnosis not present

## 2022-05-28 DIAGNOSIS — E785 Hyperlipidemia, unspecified: Secondary | ICD-10-CM

## 2022-05-28 DIAGNOSIS — G479 Sleep disorder, unspecified: Secondary | ICD-10-CM

## 2022-05-28 NOTE — Assessment & Plan Note (Addendum)
Slightly above goal.  I encouraged her to start checking her blood pressure at home to determine if she is at a goal of less than 130/80.  She will send me her readings in 2 weeks. She will continue lisinopril-HCTZ 1 tablet daily.  Check labs.

## 2022-05-28 NOTE — Progress Notes (Signed)
[name], MD Phone: [phone]  Tammy Dalton is a 77 y.o. female who presents today for f/u.  HYPERTENSION Disease Monitoring Home BP Monitoring 120-130/80 when [facility] Chest pain- no    Dyspnea- no Medications Compliance-  taking lisinopril/HCTZ.   Edema- no BMET    Component Value Date/Time   NA 140 11/22/2021 1556   K 3.7 11/22/2021 1556   CL 102 11/22/2021 1556   CO2 28 11/22/2021 1556   GLUCOSE 78 11/22/2021 1556   BUN 18 11/22/2021 1556   CREATININE 1.18 11/22/2021 1556   CREATININE 1.07 (H) 07/10/2019 1625   CALCIUM 9.2 11/22/2021 1556   HYPOTHYROIDISM Disease Monitoring Weight changes: no  Skin Changes: no  Heat/Cold intolerance: no  Medication Monitoring Compliance:  taking synthroid   Last TSH:   Lab Results  Component Value Date   TSH 1.30 11/22/2021   Insomnia: Continues on Ambien.  Notes no drowsiness with this.  She notes at times she has trouble falling asleep.  She notes she tries to go to bed around 10 PM and at times will not fall asleep until 1 AM.  Notes she is oftentimes thinking of things as there are days when she has to get up early and she worries about the alarm clock going on.  She works Monday, Tuesday, and Wednesday and gets up at 5:30 AM those days.  The other days of the week she gets up around 8 AM.  She does have sweet tea or soda around 6 PM daily.  No alcohol.  She tries to limit screens the hour before bed.  Social History   Tobacco Use  Smoking Status Former   Types: Cigarettes  Smokeless Tobacco Never    Current Outpatient Medications on File Prior to Visit  Medication Sig Dispense Refill   aspirin EC 81 MG tablet Take 81 mg by mouth daily. Taking every other day.     cholecalciferol (VITAMIN D) 1000 units tablet Take 1,000 Units by mouth daily.     ferrous sulfate 325 (65 FE) MG tablet Take 325 mg by mouth daily with breakfast.     fluticasone (FLONASE) 50 MCG/ACT nasal spray Place 2 sprays  into both nostrils daily. 16 g 0   levothyroxine (SYNTHROID) 100 MCG tablet Take 1 tablet (100 mcg total) by mouth daily before breakfast. 90 tablet 3   lisinopril-hydrochlorothiazide (ZESTORETIC) 20-25 MG tablet Take 1 tablet by mouth daily. 90 tablet 3   loratadine (CLARITIN) 10 MG tablet Take 1 tablet (10 mg total) by mouth daily. 30 tablet 0   meloxicam (MOBIC) 15 MG tablet Take 1 tablet by mouth once daily for 30 days     rosuvastatin (CRESTOR) 40 MG tablet Take 1 tablet (40 mg total) by mouth daily. 90 tablet 3   zolpidem (AMBIEN) 5 MG tablet TAKE 1 TABLET BY MOUTH AT BEDTIME AS NEEDED FOR SLEEP 90 tablet 0   No current facility-administered medications on file prior to visit.     ROS see history of present illness  Objective  Physical Exam Vitals:   05/28/22 1543 05/28/22 1559  BP: 136/82 136/82  Pulse: 72   Temp: 97.6 F (36.4 C)   SpO2: 98%     BP Readings from Last 3 Encounters:  05/28/22 136/82  04/18/22 130/80  11/22/21 120/60   Wt Readings from Last 3 Encounters:  05/28/22 139 lb 9.6 oz (63.3 kg)  04/27/22 139 lb (63 kg)  04/18/22 139 lb 12.8 oz (  63.4 kg)    Physical Exam Constitutional:      General: She is not in acute distress.    Appearance: She is not diaphoretic.  Cardiovascular:     Rate and Rhythm: Normal rate and regular rhythm.     Heart sounds: Normal heart sounds.  Pulmonary:     Effort: Pulmonary effort is normal.     Breath sounds: Normal breath sounds.  Musculoskeletal:     Right lower leg: No edema.     Left lower leg: No edema.  Skin:    General: Skin is warm and dry.  Neurological:     Mental Status: She is alert.      Assessment/Plan: Please see individual problem list.  Primary hypertension Assessment & Plan: Slightly above goal.  I encouraged her to start checking her blood pressure at home to determine if she is at a goal of less than 130/80.  She will send me her readings in 2 weeks. She will continue lisinopril-HCTZ 1  tablet daily.  Check labs.  Orders: -     Comprehensive metabolic panel  Hypothyroidism, unspecified type Assessment & Plan: Check TSH.  Continue Synthroid 100 mcg daily.  Orders: -     TSH  Hyperlipidemia, unspecified hyperlipidemia type Assessment & Plan: Patient will continue Crestor 40 mg daily.  Will check lipid panel today.  Orders: -     Comprehensive metabolic panel -     Lipid panel  Sleeping difficulty Assessment & Plan: Patient will continue Ambien 5 mg nightly as needed for sleep.  Discussed having a consistent sleep schedule and cutting out caffeine in the evening to see if this things help with her trouble falling asleep.   Arthritis Assessment & Plan: Patient reports injections in one of her hands for arthritis last week.  She continues to follow with orthopedics for this.     Return in about 6 months (around 11/28/2022) for CPE.   [name], MD [place]

## 2022-05-28 NOTE — Assessment & Plan Note (Signed)
Patient will continue Ambien 5 mg nightly as needed for sleep.  Discussed having a consistent sleep schedule and cutting out caffeine in the evening to see if this things help with her trouble falling asleep.

## 2022-05-28 NOTE — Patient Instructions (Signed)
Nice to see you. Please start checking your blood pressure at home.  Your goal blood pressure is less than 130/80. Please cut out caffeine in the evening and try to stick with a consistent wake time each morning to see if that helps with your sleep.

## 2022-05-28 NOTE — Assessment & Plan Note (Signed)
Patient will continue Crestor 40 mg daily.  Will check lipid panel today.

## 2022-05-28 NOTE — Assessment & Plan Note (Signed)
Patient reports injections in one of her hands for arthritis last week.  She continues to follow with orthopedics for this.

## 2022-05-28 NOTE — Assessment & Plan Note (Signed)
Check TSH.  Continue Synthroid 100 mcg daily. 

## 2022-05-29 ENCOUNTER — Other Ambulatory Visit: Payer: Self-pay | Admitting: Family Medicine

## 2022-05-29 DIAGNOSIS — R7989 Other specified abnormal findings of blood chemistry: Secondary | ICD-10-CM

## 2022-05-29 DIAGNOSIS — E039 Hypothyroidism, unspecified: Secondary | ICD-10-CM

## 2022-05-29 LAB — LIPID PANEL
Cholesterol: 154 mg/dL (ref 0–200)
HDL: 57.3 mg/dL (ref >39.00)
LDL Cholesterol: 64 mg/dL (ref 0–99)
NonHDL: 97.15
Total CHOL/HDL Ratio: 3
Triglycerides: 166 mg/dL — ABNORMAL HIGH (ref 0.0–149.0)
VLDL: 33.2 mg/dL (ref 0.0–40.0)

## 2022-05-29 LAB — COMPREHENSIVE METABOLIC PANEL
ALT: 71 U/L — ABNORMAL HIGH (ref 0–35)
AST: 32 U/L (ref 0–37)
Albumin: 4.1 g/dL (ref 3.5–5.2)
Alkaline Phosphatase: 70 U/L (ref 39–117)
BUN: 25 mg/dL — ABNORMAL HIGH (ref 6–23)
CO2: 28 mEq/L (ref 19–32)
Calcium: 9.3 mg/dL (ref 8.4–10.5)
Chloride: 101 mEq/L (ref 96–112)
Creatinine, Ser: 1.12 mg/dL (ref 0.40–1.20)
GFR: 48.1 mL/min — ABNORMAL LOW (ref >60.00)
Glucose, Bld: 87 mg/dL (ref 70–99)
Potassium: 3.9 mEq/L (ref 3.5–5.1)
Sodium: 140 mEq/L (ref 135–145)
Total Bilirubin: 0.5 mg/dL (ref 0.2–1.2)
Total Protein: 6.5 g/dL (ref 6.0–8.3)

## 2022-05-29 LAB — TSH: TSH: 5.61 u[IU]/mL — ABNORMAL HIGH (ref 0.35–5.50)

## 2022-05-31 ENCOUNTER — Encounter: Payer: Self-pay | Admitting: Nurse Practitioner

## 2022-05-31 ENCOUNTER — Ambulatory Visit (INDEPENDENT_AMBULATORY_CARE_PROVIDER_SITE_OTHER): Payer: Medicare Other | Admitting: Nurse Practitioner

## 2022-05-31 VITALS — BP 130/70 | HR 82 | Temp 98.5°F | Ht 62.0 in | Wt 140.0 lb

## 2022-05-31 DIAGNOSIS — R058 Other specified cough: Secondary | ICD-10-CM

## 2022-05-31 DIAGNOSIS — J029 Acute pharyngitis, unspecified: Secondary | ICD-10-CM | POA: Insufficient documentation

## 2022-05-31 MED ORDER — BENZONATATE 100 MG PO CAPS: 100.0000 mg | ORAL_CAPSULE | Freq: Three times a day (TID) | ORAL | 1 refills | Status: DC | PRN

## 2022-05-31 MED ORDER — AMOXICILLIN 500 MG PO CAPS: 500.0000 mg | ORAL_CAPSULE | Freq: Two times a day (BID) | ORAL | 0 refills | Status: AC

## 2022-05-31 NOTE — Progress Notes (Signed)
Established Patient Office Visit  Subjective:  Patient ID: Tammy Dalton, female    DOB: [birth year]  Age: 77 y.o. MRN: [id number]  CC:  Chief Complaint  Patient presents with   Acute Visit    Cough/ sore throat/ runny nose/ headache    HPI  Tammy Dalton presents for cough since last 3 days. She is taking Delsym cough syrup and DayQuil. She also complaint of heaviness of head.   Denies fever, SOB or chest pain.  She reports sore throat and it hurts to swallow.    HPI   Past Medical History:  Diagnosis Date   Hyperlipidemia    Hypertension    Thyroid disease     Past Surgical History:  Procedure Laterality Date   anal sphincterotomy and fissurectomy  2009   COLONOSCOPY  05/24/2011   Dr. Jill Side done at [place]  05/20/2000   Dr. Jamal Collin; Sharp bend at 50 cm. Scope could not be advanced any further even with glucagon.   COLONOSCOPY WITH PROPOFOL N/A 04/24/2016   Procedure: COLONOSCOPY WITH PROPOFOL;  Surgeon: Christene Lye, MD;  Location: [facility] ENDOSCOPY;  Service: Endoscopy;  Laterality: N/A;   EYE SURGERY Bilateral 2010   cataract surgery.    TONSILLECTOMY      Family History  Problem Relation Age of Onset   Depression Mother    Heart disease Father    Early death Sister    Alcohol abuse Brother    Stroke Paternal Grandfather     Social History   Socioeconomic History   Marital status: Single    Spouse name: Not on file   Number of children: Not on file   Years of education: Not on file   Highest education level: Not on file  Occupational History   Not on file  Tobacco Use   Smoking status: Former    Types: Cigarettes   Smokeless tobacco: Never  Vaping Use   Vaping Use: Never used  Substance and Sexual Activity   Alcohol use: No   Drug use: No   Sexual activity: Never  Other Topics Concern   Not on file  Social History Narrative   Not on file   Social Determinants of Health    Financial Resource Strain: Low Risk  (04/27/2022)   Overall Financial Resource Strain (CARDIA)    Difficulty of Paying Living Expenses: Not hard at all  Food Insecurity: No Food Insecurity (04/27/2022)   Hunger Vital Sign    Worried About Running Out of Food in the Last Year: Never true    [place] in the Last Year: Never true  Transportation Needs: No Transportation Needs (04/27/2022)   PRAPARE - Hydrologist (Medical): No    Lack of Transportation (Non-Medical): No  Physical Activity: Sufficiently Active (04/27/2022)   Exercise Vital Sign    Days of Exercise per Week: 3 days    Minutes of Exercise per Session: 60 min  Stress: No Stress Concern Present (04/27/2022)   [place]    Feeling of Stress : Not at all  Social Connections: Unknown (04/27/2022)   Social Connection and Isolation Panel [NHANES]    Frequency of Communication with Friends and Family: More than three times a week    Frequency of Social Gatherings with Friends and Family: More than three times a week    Attends Religious Services: Not on file  Active Member of Clubs or Organizations: Not on file    Attends Club or Organization Meetings: Not on file    Marital Status: Not on file  Intimate Partner Violence: Not At Risk (04/27/2022)   Humiliation, Afraid, Rape, and Kick questionnaire    Fear of Current or Ex-Partner: No    Emotionally Abused: No    Physically Abused: No    Sexually Abused: No     Outpatient Medications Prior to Visit  Medication Sig Dispense Refill   aspirin EC 81 MG tablet Take 81 mg by mouth daily. Taking every other day.     cholecalciferol (VITAMIN D) 1000 units tablet Take 1,000 Units by mouth daily.     ferrous sulfate 325 (65 FE) MG tablet Take 325 mg by mouth daily with breakfast.     fluticasone (FLONASE) 50 MCG/ACT nasal spray Place 2 sprays into both nostrils daily. 16 g 0   levothyroxine  (SYNTHROID) 100 MCG tablet Take 1 tablet (100 mcg total) by mouth daily before breakfast. 90 tablet 3   lisinopril-hydrochlorothiazide (ZESTORETIC) 20-25 MG tablet Take 1 tablet by mouth daily. 90 tablet 3   loratadine (CLARITIN) 10 MG tablet Take 1 tablet (10 mg total) by mouth daily. 30 tablet 0   meloxicam (MOBIC) 15 MG tablet Take 1 tablet by mouth once daily for 30 days     rosuvastatin (CRESTOR) 40 MG tablet Take 1 tablet (40 mg total) by mouth daily. 90 tablet 3   zolpidem (AMBIEN) 5 MG tablet TAKE 1 TABLET BY MOUTH AT BEDTIME AS NEEDED FOR SLEEP 90 tablet 0   No facility-administered medications prior to visit.    Allergies  Allergen Reactions   Codeine Other (See Comments)    nausea    ROS Review of Systems  Constitutional: Negative.   HENT:  Positive for rhinorrhea and sore throat.   Respiratory:  Positive for cough.   Gastrointestinal: Negative.   Genitourinary: Negative.   Musculoskeletal: Negative.   Skin: Negative.   Neurological:  Positive for headaches.  Psychiatric/Behavioral: Negative.        Objective:    Physical Exam Constitutional:      Appearance: Normal appearance. She is normal weight.  HENT:     Head: Normocephalic.     Right Ear: Tympanic membrane normal.     Left Ear: Tympanic membrane normal.     Mouth/Throat:     Pharynx: Posterior oropharyngeal erythema present. No oropharyngeal exudate.     Tonsils: 1+ on the right. 1+ on the left.  Cardiovascular:     Rate and Rhythm: Normal rate and regular rhythm.  Neurological:     Mental Status: She is alert.     BP 130/70   Pulse 82   Temp 98.5 F (36.9 C) (Oral)   Ht 5\' 2"  (1.575 m)   Wt 140 lb (63.5 kg)   SpO2 96%   BMI 25.61 kg/m  Wt Readings from Last 3 Encounters:  05/31/22 140 lb (63.5 kg)  05/28/22 139 lb 9.6 oz (63.3 kg)  04/27/22 139 lb (63 kg)     Health Maintenance  Topic Date Due   COVID-19 Vaccine (7 - 2023-24 season) 06/16/2022 (Originally 04/26/2022)   Medicare  Annual Wellness (AWV)  04/28/2023   COLONOSCOPY (Pts 45-71yrs Insurance coverage will need to be confirmed)  04/24/2026   DTaP/Tdap/Td (3 - Td or Tdap) 04/14/2027   Pneumonia Vaccine 50+ Years old  Completed   INFLUENZA VACCINE  Completed   DEXA SCAN  Completed  Hepatitis C Screening  Completed   Zoster Vaccines- Shingrix  Completed   HPV VACCINES  Aged Out    There are no preventive care reminders to display for this patient.  Lab Results  Component Value Date   TSH 5.61 (H) 05/28/2022   Lab Results  Component Value Date   WBC 7.7 07/10/2019   HGB 12.5 07/10/2019   HCT 38.6 07/10/2019   MCV 89.8 07/10/2019   PLT 221 07/10/2019   Lab Results  Component Value Date   NA 140 05/28/2022   K 3.9 05/28/2022   CO2 28 05/28/2022   GLUCOSE 87 05/28/2022   BUN 25 (H) 05/28/2022   CREATININE 1.12 05/28/2022   BILITOT 0.5 05/28/2022   ALKPHOS 70 05/28/2022   AST 32 05/28/2022   ALT 71 (H) 05/28/2022   PROT 6.5 05/28/2022   ALBUMIN 4.1 05/28/2022   CALCIUM 9.3 05/28/2022   GFR 48.10 (L) 05/28/2022   Lab Results  Component Value Date   CHOL 154 05/28/2022   Lab Results  Component Value Date   HDL 57.30 05/28/2022   Lab Results  Component Value Date   LDLCALC 64 05/28/2022   Lab Results  Component Value Date   TRIG 166.0 (H) 05/28/2022   Lab Results  Component Value Date   CHOLHDL 3 05/28/2022   Lab Results  Component Value Date   HGBA1C 5.6 08/03/2019      Assessment & Plan:  Other cough Assessment & Plan: POCT strep is negative. Advised patient to increase hydration and drink warm fluids. Amoxicillin and benzonatate Perle sent to pharmacy.   Sorethroat -     POCT rapid strep A  Other orders -     Amoxicillin; Take 1 capsule (500 mg total) by mouth 2 (two) times daily for 7 days.  Dispense: 14 capsule; Refill: 0 -     Benzonatate; Take 1 capsule (100 mg total) by mouth 3 (three) times daily as needed for cough.  Dispense: 30 capsule; Refill:  1    Follow-up: Return if symptoms worsen or fail to improve.   Theresia Lo, NP

## 2022-05-31 NOTE — Patient Instructions (Signed)
Strep test negative in the office today. Will treatment the cough with amoxicillin and Tessalon Perles. Increase hydration.

## 2022-06-12 ENCOUNTER — Encounter: Payer: Self-pay | Admitting: Nurse Practitioner

## 2022-06-12 DIAGNOSIS — R058 Other specified cough: Secondary | ICD-10-CM | POA: Insufficient documentation

## 2022-06-12 DIAGNOSIS — R059 Cough, unspecified: Secondary | ICD-10-CM | POA: Insufficient documentation

## 2022-06-12 LAB — POCT RAPID STREP A (OFFICE): Rapid Strep A Screen: NEGATIVE

## 2022-06-12 NOTE — Assessment & Plan Note (Signed)
POCT strep is negative. Advised patient to increase hydration and drink warm fluids. Amoxicillin and benzonatate Perle sent to pharmacy.

## 2022-06-27 ENCOUNTER — Other Ambulatory Visit (INDEPENDENT_AMBULATORY_CARE_PROVIDER_SITE_OTHER): Payer: Medicare Other

## 2022-06-27 ENCOUNTER — Telehealth: Payer: Self-pay | Admitting: Family Medicine

## 2022-06-27 DIAGNOSIS — E039 Hypothyroidism, unspecified: Secondary | ICD-10-CM

## 2022-06-27 DIAGNOSIS — R7989 Other specified abnormal findings of blood chemistry: Secondary | ICD-10-CM | POA: Diagnosis not present

## 2022-06-27 DIAGNOSIS — I10 Essential (primary) hypertension: Secondary | ICD-10-CM

## 2022-06-27 LAB — TSH: TSH: 2.35 u[IU]/mL (ref 0.35–5.50)

## 2022-06-27 LAB — HEPATIC FUNCTION PANEL
ALT: 44 U/L — ABNORMAL HIGH (ref 0–35)
AST: 33 U/L (ref 0–37)
Albumin: 4.6 g/dL (ref 3.5–5.2)
Alkaline Phosphatase: 71 U/L (ref 39–117)
Bilirubin, Direct: 0.1 mg/dL (ref 0.0–0.3)
Total Bilirubin: 0.7 mg/dL (ref 0.2–1.2)
Total Protein: 6.7 g/dL (ref 6.0–8.3)

## 2022-06-27 NOTE — Telephone Encounter (Signed)
Retrieved Patient's BP readings and put them in Dr. Ellen Henri review basket

## 2022-06-27 NOTE — Telephone Encounter (Signed)
Patient returned office phone call. She agrees for Dr Caryl Bis to increase her lisinopril. She will call office on the 1st day she starts new dosage to make a lab and BP check with nurse. Please put orders in. Thanks

## 2022-06-27 NOTE — Telephone Encounter (Signed)
Please of the patient know that her blood pressures are slightly above goal.  I would like to increase the lisinopril portion of her lisinopril-HCTZ tablet.  I can send that to the pharmacy when she speak with her.  She would need lab work and a blood pressure check with nursing 10 days after starting the new dose.  Please get that scheduled and I can place orders.

## 2022-06-27 NOTE — Telephone Encounter (Signed)
Patient dropped off document  BP readings . Paper is up front in Dr Ellen Henri color folder.

## 2022-06-28 MED ORDER — LISINOPRIL-HYDROCHLOROTHIAZIDE 20-12.5 MG PO TABS: 2.0000 | ORAL_TABLET | Freq: Every day | ORAL | 3 refills | Status: DC

## 2022-06-28 NOTE — Telephone Encounter (Signed)
Detailed VM has been sent to pt as well as a mychart msg informing her of the dosing

## 2022-06-28 NOTE — Telephone Encounter (Signed)
Noted.  Medication sent to the pharmacy.  Given the dosing of the combined lisinopril-HCTZ tablet I had to order it for her to take 2 tablets once daily with the new dose.  Please make sure she is aware of this.  Thanks.

## 2022-06-28 NOTE — Addendum Note (Signed)
Addended by: Leone Haven on: 06/28/2022 09:33 AM   Modules accepted: Orders

## 2022-06-28 NOTE — Telephone Encounter (Signed)
Please send in new medication, per message below

## 2022-07-09 ENCOUNTER — Ambulatory Visit (INDEPENDENT_AMBULATORY_CARE_PROVIDER_SITE_OTHER): Payer: Medicare Other

## 2022-07-09 ENCOUNTER — Other Ambulatory Visit (INDEPENDENT_AMBULATORY_CARE_PROVIDER_SITE_OTHER): Payer: Medicare Other

## 2022-07-09 VITALS — BP 118/71 | HR 93

## 2022-07-09 DIAGNOSIS — I10 Essential (primary) hypertension: Secondary | ICD-10-CM

## 2022-07-09 NOTE — Progress Notes (Signed)
Pt presented today for BP check. Pt was identified through two identifiers. After going over med list I was able to confirm pt takes lisinopril-hydrochlorothiazide (ZESTORETIC) 20-12.5 MG tablet     After rest for about 5-8 mins I checked.  BP: 118/71  P: 93  Pt denies: headache, chest pain, SOB, blurred vision, dizziness, or nay numbness.   Due to pts BP being within normal limits and her being asymptomatic I released pt.    Pt brought in some at home BP readings that have been placed in providers mailbox labeled with pts name and DOB

## 2022-07-10 LAB — BASIC METABOLIC PANEL
BUN: 22 mg/dL (ref 6–23)
CO2: 29 mEq/L (ref 19–32)
Calcium: 9.3 mg/dL (ref 8.4–10.5)
Chloride: 101 mEq/L (ref 96–112)
Creatinine, Ser: 1.14 mg/dL (ref 0.40–1.20)
GFR: 47.05 mL/min — ABNORMAL LOW (ref >60.00)
Glucose, Bld: 89 mg/dL (ref 70–99)
Potassium: 3.5 mEq/L (ref 3.5–5.1)
Sodium: 140 mEq/L (ref 135–145)

## 2022-08-17 ENCOUNTER — Telehealth: Payer: Self-pay | Admitting: Family Medicine

## 2022-08-17 NOTE — Telephone Encounter (Signed)
Pt called stating for the past two weeks she has been light headed, dizzy, has a cough and cant focus. Pt stated her BP medication has increased. Sent to access nurse

## 2022-08-17 NOTE — Telephone Encounter (Signed)
Spoke to pt  SOB: NO Headaches: NO Nauseous: NO Chest pain: NO  Blurred vision:  "Yes its like things are not in focus which makes me dizzy"   I informed pt that she may need to go be seen at an urgent care or ED especially if the above symptoms develop.   Pt stated: " I am not going anywhere this is not an emergency, I think its due to the blood pressure medication I just want you to tell him I am just going to take one pill instead of two because I think its due to that I can't focus and my perception is off."   I informed pt that I would send this information to her PCP and he would advise accordingly and she stated "its no rush"

## 2022-08-17 NOTE — Telephone Encounter (Signed)
Noted  

## 2022-08-17 NOTE — Telephone Encounter (Signed)
She can reduce her lisinopril/HCTZ to one tablet daily. If her symptoms do not improve she will need to be evaluated. If she develops any new or worsening symptoms she should be evaluated right away.

## 2022-08-17 NOTE — Telephone Encounter (Signed)
Patient states she will start the reduced lisinopril/ HCTZ on 08/18/22 and if her symptoms do not improve or she gets worse then she will be evaluated. Patient stated she will also check her BP and call us on Tuesday with an update on her symptoms and BP readings.

## 2022-08-17 NOTE — Telephone Encounter (Signed)
Access nurse called back stating that the patient wanted to speak with someone in the office. Access nurse stated that the patient is experiencing dizziness, , cough and eye sight not focused she has been experiencing this all week.

## 2022-08-24 ENCOUNTER — Telehealth: Payer: Self-pay | Admitting: Family Medicine

## 2022-08-24 NOTE — Telephone Encounter (Signed)
Byrd Hesselbach let me know that the Patient came in and stated that Dr. Birdie Sons sent her a message to come in for 5 minutes to discuss her Blood Pressure. I made Dr. Birdie Sons aware and he stated our schedule is full today but we will work her in when available so I went out and told the Patient and she stated okay we will do that. I came back to my desk and Byrd Hesselbach was already calling stating the Patient wanted to speak to me again so when I go up there the Patient states "I can not wait to be seen today so can I schedule an appointment for early next week?" And I stated yes you can and Byrd Hesselbach can help you with that. The Patient walks toward Maria's window and blurts out in front of other Patients " I guess I will stay dizzy until next week." Patient did message about dizziness and her BP on 08/17/22 at the end of the day and I did stay after a few minutes to call her back and give her Dr. Purvis Sheffield recommendations and the Patient was suppose to call us on Tuesday 08/21/22 and give [place] her BP readings from 08/18/22- 08/21/22 per the note due to Dr. Birdie Sons changing her medication. The Patient has not called or messaged since 08/17/22 but then walks in on 08/24/22 stating Dr. Birdie Sons told her to come. The Patient stated to me "I just need to come up here and discuss these medications and my BP readings with Dr. Birdie Sons."  Patient did schedule an appointment for Monday 08/27/22.

## 2022-08-24 NOTE — Telephone Encounter (Signed)
Pt walked in today stating that as per Birdie Sons, he was going to see her for 5 mins about her bp meds. I contacted Lanora Manis CMA regarding this since pt did not have an appt today. Lanora Manis came out to the lobby to speak to her and told her that they were going to work her in today, but they were not sure what time since he's booked for the day. I told her that and as per pt, she can't stay today and will come in to see him next week. I booked her for Monday 6/3 @3 :15pm

## 2022-08-24 NOTE — Telephone Encounter (Signed)
Staff came to me to relay that the patient came in today stating that I advised her to come in to discuss her BP for 5 minutes. I had not advised her to do this. I advised staff that I could work her in today, though I was not sure when I would be able to see her as I had other patients that were already scheduled to see me. After coming out from seeing a patient I was advised of the events as documented in [place] note below. Prior to this I had not been advised of any patient symptoms. At this time we will plan on seeing her as scheduled.

## 2022-08-27 ENCOUNTER — Ambulatory Visit (INDEPENDENT_AMBULATORY_CARE_PROVIDER_SITE_OTHER): Payer: Medicare Other | Admitting: Family Medicine

## 2022-08-27 ENCOUNTER — Other Ambulatory Visit: Payer: Self-pay | Admitting: Family

## 2022-08-27 ENCOUNTER — Encounter: Payer: Self-pay | Admitting: Family Medicine

## 2022-08-27 VITALS — BP 130/84 | HR 75 | Temp 98.4°F | Ht 62.0 in | Wt 142.0 lb

## 2022-08-27 DIAGNOSIS — G4700 Insomnia, unspecified: Secondary | ICD-10-CM

## 2022-08-27 DIAGNOSIS — I10 Essential (primary) hypertension: Secondary | ICD-10-CM | POA: Diagnosis not present

## 2022-08-27 NOTE — Assessment & Plan Note (Signed)
Chronic issue.  Generally well-controlled given that she was not able to tolerate a higher dose of lisinopril-HCTZ.  She will continue lisinopril-HCTZ 20-12.5 mg daily.  Will check a BMP today to see if there is any sodium abnormality that could account for her symptoms.  She will follow-up with me as scheduled in September.

## 2022-08-27 NOTE — Progress Notes (Signed)
Marikay Alar, MD Phone: [phone]  Tammy Dalton is a 77 y.o. female who presents today for f/u.  HYPERTENSION Disease Monitoring Home BP Monitoring 130s-140s/60s-70s Chest pain- no    Dyspnea- no Medications Compliance-  taking lisinopril/HCTZ. Lightheadedness-  resolved  Edema- no Patient report issues with feeling like she was drunk and a little dizzy on the higher dose of lisinopril-HCTZ.  She reduced from 2 tablets daily to 1 tablet daily and feels better though maybe not quite back to normal. BMET    Component Value Date/Time   NA 140 07/09/2022 1512   K 3.5 07/09/2022 1512   CL 101 07/09/2022 1512   CO2 29 07/09/2022 1512   GLUCOSE 89 07/09/2022 1512   BUN 22 07/09/2022 1512   CREATININE 1.14 07/09/2022 1512   CREATININE 1.07 (H) 07/10/2019 1625   CALCIUM 9.3 07/09/2022 1512     Social History   Tobacco Use  Smoking Status Former   Types: Cigarettes  Smokeless Tobacco Never    Current Outpatient Medications on File Prior to Visit  Medication Sig Dispense Refill   aspirin EC 81 MG tablet Take 81 mg by mouth daily. Taking every other day.     cholecalciferol (VITAMIN D) 1000 units tablet Take 1,000 Units by mouth daily.     ferrous sulfate 325 (65 FE) MG tablet Take 325 mg by mouth daily with breakfast.     levothyroxine (SYNTHROID) 100 MCG tablet Take 1 tablet (100 mcg total) by mouth daily before breakfast. 90 tablet 3   lisinopril-hydrochlorothiazide (ZESTORETIC) 20-12.5 MG tablet Take 2 tablets by mouth daily. 180 tablet 3   rosuvastatin (CRESTOR) 40 MG tablet Take 1 tablet (40 mg total) by mouth daily. 90 tablet 3   zolpidem (AMBIEN) 5 MG tablet TAKE 1 TABLET BY MOUTH AT BEDTIME AS NEEDED FOR SLEEP 90 tablet 0   No current facility-administered medications on file prior to visit.     ROS see history of present illness  Objective  Physical Exam Vitals:   08/27/22 1536  BP: 130/84  Pulse: 75  Temp: 98.4 F (36.9 C)  SpO2: 97%    BP  Readings from Last 3 Encounters:  08/27/22 130/84  07/09/22 118/71  05/31/22 130/70   Wt Readings from Last 3 Encounters:  08/27/22 142 lb (64.4 kg)  05/31/22 140 lb (63.5 kg)  05/28/22 139 lb 9.6 oz (63.3 kg)    Physical Exam Constitutional:      General: She is not in acute distress.    Appearance: She is not diaphoretic.  Cardiovascular:     Rate and Rhythm: Normal rate and regular rhythm.     Heart sounds: Normal heart sounds.  Pulmonary:     Effort: Pulmonary effort is normal.     Breath sounds: Normal breath sounds.  Skin:    General: Skin is warm and dry.  Neurological:     Mental Status: She is alert.      Assessment/Plan: Please see individual problem list.  Primary hypertension Assessment & Plan: Chronic issue.  Generally well-controlled given that she was not able to tolerate a higher dose of lisinopril-HCTZ.  She will continue lisinopril-HCTZ 20-12.5 mg daily.  Will check a BMP today to see if there is any sodium abnormality that could account for her symptoms.  She will follow-up with me as scheduled in September.  Orders: -     Basic metabolic panel     Return for As scheduled.   Marikay Alar, MD [facility] -  [employer]

## 2022-08-28 LAB — BASIC METABOLIC PANEL
BUN: 17 mg/dL (ref 6–23)
CO2: 28 mEq/L (ref 19–32)
Calcium: 9.4 mg/dL (ref 8.4–10.5)
Chloride: 102 mEq/L (ref 96–112)
Creatinine, Ser: 1.08 mg/dL (ref 0.40–1.20)
GFR: 50.15 mL/min — ABNORMAL LOW (ref >60.00)
Glucose, Bld: 75 mg/dL (ref 70–99)
Potassium: 3.9 mEq/L (ref 3.5–5.1)
Sodium: 139 mEq/L (ref 135–145)

## 2022-08-30 NOTE — Telephone Encounter (Signed)
Prescription Request  08/30/2022  LOV: Visit date not found  What is the name of the medication or equipment? zolpidem   Have you contacted your pharmacy to request a refill? Yes   Which pharmacy would you like this sent to?  [facility] [address] [place] - [address] [place] [address] Phone: [phone] Fax: [phone]    Patient notified that their request is being sent to the clinical staff for review and that they should receive a response within 2 business days.   Please advise at [facility] [phone]

## 2022-08-31 NOTE — Telephone Encounter (Signed)
Sent to pharmacy 

## 2022-10-18 DIAGNOSIS — M67449 Ganglion, unspecified hand: Secondary | ICD-10-CM | POA: Insufficient documentation

## 2022-10-18 DIAGNOSIS — M189 Osteoarthritis of first carpometacarpal joint, unspecified: Secondary | ICD-10-CM | POA: Insufficient documentation

## 2022-11-08 ENCOUNTER — Encounter (INDEPENDENT_AMBULATORY_CARE_PROVIDER_SITE_OTHER): Payer: Self-pay

## 2022-11-23 ENCOUNTER — Other Ambulatory Visit: Payer: Self-pay | Admitting: Family Medicine

## 2022-11-23 ENCOUNTER — Other Ambulatory Visit: Payer: Self-pay

## 2022-11-23 DIAGNOSIS — E785 Hyperlipidemia, unspecified: Secondary | ICD-10-CM

## 2022-11-28 ENCOUNTER — Encounter: Payer: Self-pay | Admitting: Family Medicine

## 2022-11-28 ENCOUNTER — Encounter: Payer: Medicare Other | Admitting: Family Medicine

## 2022-11-28 ENCOUNTER — Ambulatory Visit (INDEPENDENT_AMBULATORY_CARE_PROVIDER_SITE_OTHER): Payer: Medicare Other | Admitting: Family Medicine

## 2022-11-28 VITALS — BP 128/82 | HR 72 | Temp 98.0°F | Ht 62.0 in | Wt 140.6 lb

## 2022-11-28 DIAGNOSIS — E663 Overweight: Secondary | ICD-10-CM

## 2022-11-28 DIAGNOSIS — E039 Hypothyroidism, unspecified: Secondary | ICD-10-CM

## 2022-11-28 DIAGNOSIS — Z0000 Encounter for general adult medical examination without abnormal findings: Secondary | ICD-10-CM

## 2022-11-28 DIAGNOSIS — E785 Hyperlipidemia, unspecified: Secondary | ICD-10-CM

## 2022-11-28 DIAGNOSIS — M8580 Other specified disorders of bone density and structure, unspecified site: Secondary | ICD-10-CM

## 2022-11-28 NOTE — Assessment & Plan Note (Signed)
Physical exam completed.  Encouraged healthy diet and exercise.  Mammogram is up-to-date.  She is aged out of further colonoscopies.  She will get her flu vaccine and COVID-vaccine through the pharmacy.  Lab work as outlined.

## 2022-11-28 NOTE — Progress Notes (Signed)
Marikay Alar, MD Phone: [phone]  Tammy Dalton is a 77 y.o. female who presents today for CPE.  Diet: Eats a good variety, does have some sweets daily, drinks half-and-half sweet tea, has fried foods 3 times a week, does get good fruit and vegetable intake Exercise: Walks 3 times a week, plays golf Pap smear: Aged out, reports no history of abnormal Pap smears, reports having them consistently Colonoscopy: Last colonoscopy 2018 was negative Mammogram: Patient reports she has this every other year, she is up-to-date Family history-  Colon cancer: no  Breast cancer: maternal cousin  Ovarian cancer: no Menses: postmenopausal Vaccines-   Flu: due  Tetanus: UTD  Shingles: UTD  COVID19: due  Pneumonia: UTD  RSV: UTD Hep C Screening: UTD Tobacco use: no Alcohol use: 1-2/week Illicit Drug use: no Dentist: yes Ophthalmology: yes   Active Ambulatory Problems    Diagnosis Date Noted   Hyperlipidemia 06/20/2016   Hypertension 06/20/2016   Hypothyroidism 06/20/2016   Sleeping difficulty 06/20/2016   Skin cancer 06/20/2016   Osteopenia 06/24/2017   Stage 3a chronic kidney disease (HCC) 04/01/2019   Left elbow pain 07/10/2019   Postnasal drip 05/15/2021   Viral upper respiratory illness 04/18/2022   Arthritis 05/28/2022   Pain in thoracic spine 05/07/2022   Sorethroat 05/31/2022   Cough 06/12/2022   Routine general medical examination at a health care facility 11/28/2022   Resolved Ambulatory Problems    Diagnosis Date Noted   Flu 06/02/2017   Elevated glucose 08/03/2019   Acute non-recurrent sinusitis 02/28/2021   Past Medical History:  Diagnosis Date   Thyroid disease     Family History  Problem Relation Age of Onset   Depression Mother    Heart disease Father    Early death Sister    Alcohol abuse Brother    Stroke Paternal Grandfather     Social History   Socioeconomic History   Marital status: Single    Spouse name: Not on file   Number of  children: Not on file   Years of education: Not on file   Highest education level: Not on file  Occupational History   Not on file  Tobacco Use   Smoking status: Former    Types: Cigarettes   Smokeless tobacco: Never  Vaping Use   Vaping status: Never Used  Substance and Sexual Activity   Alcohol use: No   Drug use: No   Sexual activity: Never  Other Topics Concern   Not on file  Social History Narrative   Not on file   Social Determinants of Health   Financial Resource Strain: Low Risk  (04/27/2022)   Overall Financial Resource Strain (CARDIA)    Difficulty of Paying Living Expenses: Not hard at all  Food Insecurity: No Food Insecurity (04/27/2022)   Hunger Vital Sign    Worried About Running Out of Food in the Last Year: Never true    Ran Out of Food in the Last Year: Never true  Transportation Needs: No Transportation Needs (04/27/2022)   PRAPARE - Administrator, Civil Service (Medical): No    Lack of Transportation (Non-Medical): No  Physical Activity: Sufficiently Active (04/27/2022)   Exercise Vital Sign    Days of Exercise per Week: 3 days    Minutes of Exercise per Session: 60 min  Stress: No Stress Concern Present (04/27/2022)   [employer] of Occupational Health - Occupational Stress Questionnaire    Feeling of Stress : Not at all  Social Connections: Unknown (04/27/2022)   Social Connection and Isolation Panel [NHANES]    Frequency of Communication with Friends and Family: More than three times a week    Frequency of Social Gatherings with Friends and Family: More than three times a week    Attends Religious Services: Not on file    Active Member of Clubs or Organizations: Not on file    Attends Banker Meetings: Not on file    Marital Status: Not on file  Intimate Partner Violence: Not At Risk (04/27/2022)   Humiliation, Afraid, Rape, and Kick questionnaire    Fear of Current or Ex-Partner: No    Emotionally Abused: No    Physically  Abused: No    Sexually Abused: No    ROS  General:  Negative for nexplained weight loss, fever Skin: Negative for new or changing mole, sore that won't heal HEENT: Negative for trouble hearing, trouble seeing, ringing in ears, mouth sores, hoarseness, change in voice, dysphagia. CV:  Negative for chest pain, dyspnea, edema, palpitations Resp: Negative for cough, dyspnea, hemoptysis GI: Negative for nausea, vomiting, diarrhea, constipation, abdominal pain, melena, hematochezia. GU: Negative for dysuria, incontinence, urinary hesitance, hematuria, vaginal or penile discharge, polyuria, sexual difficulty, lumps in testicle or breasts MSK: Negative for muscle cramps or aches, joint pain or swelling Neuro: Negative for headaches, weakness, numbness, dizziness, passing out/fainting Psych: Negative for depression, anxiety, memory problems  Objective  Physical Exam Vitals:   11/28/22 1500  BP: 128/82  Pulse: 72  Temp: 98 F (36.7 C)  SpO2: 98%    BP Readings from Last 3 Encounters:  11/28/22 128/82  08/27/22 130/84  07/09/22 118/71   Wt Readings from Last 3 Encounters:  11/28/22 140 lb 9.6 oz (63.8 kg)  08/27/22 142 lb (64.4 kg)  05/31/22 140 lb (63.5 kg)    Physical Exam Constitutional:      General: She is not in acute distress.    Appearance: She is not diaphoretic.  HENT:     Head: Normocephalic and atraumatic.  Cardiovascular:     Rate and Rhythm: Normal rate and regular rhythm.     Heart sounds: Normal heart sounds.  Pulmonary:     Effort: Pulmonary effort is normal.     Breath sounds: Normal breath sounds.  Abdominal:     General: Bowel sounds are normal. There is no distension.     Palpations: Abdomen is soft.  Musculoskeletal:     Right lower leg: No edema.     Left lower leg: No edema.  Lymphadenopathy:     Cervical: No cervical adenopathy.  Skin:    General: Skin is warm and dry.  Neurological:     Mental Status: She is alert.  Psychiatric:         Mood and Affect: Mood normal.      Assessment/Plan:   Routine general medical examination at a health care facility Assessment & Plan: Physical exam completed.  Encouraged healthy diet and exercise.  Mammogram is up-to-date.  She is aged out of further colonoscopies.  She will get her flu vaccine and COVID-vaccine through the pharmacy.  Lab work as outlined.   Hyperlipidemia, unspecified hyperlipidemia type -     Hepatic function panel  Hypothyroidism, unspecified type -     TSH  Osteopenia, unspecified location -     VITAMIN D 25 Hydroxy (Vit-D Deficiency, Fractures) -     DG Bone Density; Future  Over weight -     Hemoglobin A1c  Return in about 6 months (around 05/28/2023).   Marikay Alar, MD [facility]

## 2022-11-29 LAB — HEPATIC FUNCTION PANEL
ALT: 20 U/L (ref 0–35)
AST: 24 U/L (ref 0–37)
Albumin: 4.2 g/dL (ref 3.5–5.2)
Alkaline Phosphatase: 67 U/L (ref 39–117)
Bilirubin, Direct: 0.1 mg/dL (ref 0.0–0.3)
Total Bilirubin: 0.6 mg/dL (ref 0.2–1.2)
Total Protein: 6.7 g/dL (ref 6.0–8.3)

## 2022-11-29 LAB — VITAMIN D 25 HYDROXY (VIT D DEFICIENCY, FRACTURES): VITD: 34.03 ng/mL (ref 30.00–100.00)

## 2022-11-29 LAB — HEMOGLOBIN A1C: Hgb A1c MFr Bld: 5.9 % (ref 4.6–6.5)

## 2022-11-29 LAB — TSH: TSH: 1.19 u[IU]/mL (ref 0.35–5.50)

## 2022-12-02 ENCOUNTER — Other Ambulatory Visit: Payer: Self-pay | Admitting: Family Medicine

## 2022-12-02 DIAGNOSIS — G4700 Insomnia, unspecified: Secondary | ICD-10-CM

## 2022-12-18 LAB — HM DEXA SCAN

## 2022-12-19 ENCOUNTER — Encounter: Payer: Self-pay | Admitting: Family Medicine

## 2022-12-19 ENCOUNTER — Ambulatory Visit: Payer: Medicare Other | Admitting: Family Medicine

## 2022-12-19 VITALS — BP 118/68 | HR 94 | Temp 97.8°F | Ht 62.0 in | Wt 143.8 lb

## 2022-12-19 DIAGNOSIS — T63421A Toxic effect of venom of ants, accidental (unintentional), initial encounter: Secondary | ICD-10-CM | POA: Diagnosis not present

## 2022-12-19 NOTE — Assessment & Plan Note (Signed)
Discussed monitoring.  There is no sign of infection thus patient does not warrant antibiotics at this time.  Advised she could continue Claritin over-the-counter or she could try Benadryl though she would need to monitor for drowsiness with the Benadryl.  She can continue hydrocortisone to help with the itching.  If she develops any spreading redness, fevers, or feels ill she will let us know right away and we would consider antibiotic treatment with or without evaluation depending on specific symptoms at that time.

## 2022-12-19 NOTE — Progress Notes (Signed)
  Marikay Alar, MD Phone: [phone]  Tammy Dalton is a 77 y.o. female who presents today for fire ant bites.  Notes she got into some fire ants on Saturday.  She has bites on her toes on her right foot and her calf on her left leg.  Notes no fevers.  She does not feel ill.  She notes the biggest complaint is itching in the fire ant bite that occurred on the bottom of her foot.  She took Claritin for a couple days and is unsure if this was helpful.  She is been using hydrocortisone on it.  She has not been bursting the pustules.  Social History   Tobacco Use  Smoking Status Former   Types: Cigarettes  Smokeless Tobacco Never    Current Outpatient Medications on File Prior to Visit  Medication Sig Dispense Refill   aspirin EC 81 MG tablet Take 81 mg by mouth daily. Taking every other day.     cholecalciferol (VITAMIN D) 1000 units tablet Take 1,000 Units by mouth daily.     ferrous sulfate 325 (65 FE) MG tablet Take 325 mg by mouth daily with breakfast.     levothyroxine (SYNTHROID) 100 MCG tablet Take 1 tablet (100 mcg total) by mouth daily before breakfast. 90 tablet 3   lisinopril-hydrochlorothiazide (ZESTORETIC) 20-12.5 MG tablet Take 2 tablets by mouth daily. (Patient taking differently: Take by mouth daily. Patient takes 1 and a half tablets daily.) 180 tablet 3   rosuvastatin (CRESTOR) 40 MG tablet Take 1 tablet by mouth once daily 90 tablet 0   zolpidem (AMBIEN) 5 MG tablet TAKE 1 TABLET BY MOUTH AT BEDTIME AS NEEDED FOR SLEEP 90 tablet 0   No current facility-administered medications on file prior to visit.     ROS see history of present illness  Objective  Physical Exam Vitals:   12/19/22 0835  BP: 118/68  Pulse: 94  Temp: 97.8 F (36.6 C)  SpO2: 96%    BP Readings from Last 3 Encounters:  12/19/22 118/68  11/28/22 128/82  08/27/22 130/84   Wt Readings from Last 3 Encounters:  12/19/22 143 lb 12.8 oz (65.2 kg)  11/28/22 140 lb 9.6 oz (63.8 kg)   08/27/22 142 lb (64.4 kg)    Physical Exam Skin:    Comments: Scattered fire ant bites toes right foot and single firing right on the plantar surface of her right foot, few fire ant bites on her left calf, no significant surrounding erythema to any of these lesions      Assessment/Plan: Please see individual problem list.  Fire ant bite, accidental or unintentional, initial encounter Assessment & Plan: Discussed monitoring.  There is no sign of infection thus patient does not warrant antibiotics at this time.  Advised she could continue Claritin over-the-counter or she could try Benadryl though she would need to monitor for drowsiness with the Benadryl.  She can continue hydrocortisone to help with the itching.  If she develops any spreading redness, fevers, or feels ill she will let us know right away and we would consider antibiotic treatment with or without evaluation depending on specific symptoms at that time.    Return if symptoms worsen or fail to improve.   Marikay Alar, MD [facility] Primary Care [facility]

## 2023-01-03 ENCOUNTER — Telehealth: Payer: Self-pay | Admitting: Family Medicine

## 2023-01-03 NOTE — Telephone Encounter (Signed)
Patient is aware that the bone density results are not back and that we will contact her when the results come in.

## 2023-01-03 NOTE — Telephone Encounter (Signed)
Pt would like to know her bone density results

## 2023-01-04 ENCOUNTER — Encounter: Payer: Self-pay | Admitting: Family Medicine

## 2023-01-27 ENCOUNTER — Other Ambulatory Visit: Payer: Self-pay | Admitting: Family Medicine

## 2023-01-27 DIAGNOSIS — E039 Hypothyroidism, unspecified: Secondary | ICD-10-CM

## 2023-01-29 ENCOUNTER — Other Ambulatory Visit: Payer: Self-pay | Admitting: Family Medicine

## 2023-01-29 DIAGNOSIS — E039 Hypothyroidism, unspecified: Secondary | ICD-10-CM

## 2023-01-31 ENCOUNTER — Other Ambulatory Visit: Payer: Self-pay | Admitting: Family Medicine

## 2023-01-31 DIAGNOSIS — E039 Hypothyroidism, unspecified: Secondary | ICD-10-CM

## 2023-02-23 ENCOUNTER — Other Ambulatory Visit: Payer: Self-pay | Admitting: Family Medicine

## 2023-02-23 DIAGNOSIS — E785 Hyperlipidemia, unspecified: Secondary | ICD-10-CM

## 2023-03-04 ENCOUNTER — Telehealth: Payer: Self-pay | Admitting: Family Medicine

## 2023-03-04 DIAGNOSIS — G4700 Insomnia, unspecified: Secondary | ICD-10-CM

## 2023-03-06 NOTE — Telephone Encounter (Signed)
Prescription Request  03/06/2023  LOV: 12/19/2022  What is the name of the medication or equipment? zolpidem   Have you contacted your pharmacy to request a refill? Yes   Which pharmacy would you like this sent to?  [facility] [address], [place] - [address] [place] [address] Phone: [phone] Fax: [phone]    Patient notified that their request is being sent to the clinical staff for review and that they should receive a response within 2 business days.   Please advise at Mobile [phone] (mobile)

## 2023-03-08 NOTE — Telephone Encounter (Signed)
I sent this in on 03/06/2023 for the patient.

## 2023-03-27 HISTORY — PX: OTHER SURGICAL HISTORY: SHX169

## 2023-04-12 ENCOUNTER — Other Ambulatory Visit: Payer: Self-pay | Admitting: Family Medicine

## 2023-04-12 DIAGNOSIS — E039 Hypothyroidism, unspecified: Secondary | ICD-10-CM

## 2023-04-17 DIAGNOSIS — M79644 Pain in right finger(s): Secondary | ICD-10-CM | POA: Insufficient documentation

## 2023-05-03 ENCOUNTER — Ambulatory Visit: Payer: Medicare Other | Admitting: Nurse Practitioner

## 2023-05-03 ENCOUNTER — Ambulatory Visit (INDEPENDENT_AMBULATORY_CARE_PROVIDER_SITE_OTHER): Payer: Medicare Other | Admitting: *Deleted

## 2023-05-03 ENCOUNTER — Encounter: Payer: Self-pay | Admitting: Nurse Practitioner

## 2023-05-03 VITALS — Ht 62.0 in | Wt 140.0 lb

## 2023-05-03 VITALS — BP 138/78 | HR 74 | Temp 98.3°F | Resp 17 | Ht 62.0 in | Wt 142.1 lb

## 2023-05-03 DIAGNOSIS — I10 Essential (primary) hypertension: Secondary | ICD-10-CM | POA: Diagnosis not present

## 2023-05-03 DIAGNOSIS — Z0000 Encounter for general adult medical examination without abnormal findings: Secondary | ICD-10-CM | POA: Diagnosis not present

## 2023-05-03 DIAGNOSIS — R051 Acute cough: Secondary | ICD-10-CM | POA: Diagnosis not present

## 2023-05-03 MED ORDER — PSEUDOEPH-BROMPHEN-DM 30-2-10 MG/5ML PO SYRP: 5.0000 mL | ORAL_SOLUTION | Freq: Four times a day (QID) | ORAL | 0 refills | Status: DC | PRN

## 2023-05-03 MED ORDER — PREDNISONE 20 MG PO TABS
40.0000 mg | ORAL_TABLET | Freq: Every day | ORAL | 0 refills | Status: DC
Start: 2023-05-03 — End: 2023-06-20

## 2023-05-03 MED ORDER — ALBUTEROL SULFATE HFA 108 (90 BASE) MCG/ACT IN AERS: 2.0000 | INHALATION_SPRAY | Freq: Four times a day (QID) | RESPIRATORY_TRACT | 0 refills | Status: DC | PRN

## 2023-05-03 NOTE — Progress Notes (Signed)
 Leron Glance, NP-C Phone: [phone]  Tammy Dalton is a 78 y.o. female who presents today for cough.   Discussed the use of AI scribe software for clinical note transcription with the patient, who gave verbal consent to proceed.  History of Present Illness   Tammy Dalton is a 78 year old female who presents with a persistent cough lasting three weeks.  She has been experiencing a persistent dry cough for three weeks, which worsens at night, often starting in the evening and continuing through the night. Initially, she thought it was related to sinus issues as she experienced yellow-green nasal discharge, which has since resolved. No chest congestion, shortness of breath, fever, sore throat, body aches, nausea, or vomiting. She mentions headaches due to frequent coughing and nose blowing, and slight dizziness over the past couple of days, which she attributes to coughing hard. She also reports hearing herself in her ears, describing her voice as 'a little hoarse or muffled.'  She notes that many people at her workplace are experiencing similar symptoms. She has not been exposed to anything unusual that she is aware of.  She has been using DayQuil during the day and Delsym at night to manage her symptoms. She has a history of using Claritin  and Benadryl but does not take them regularly. She is not currently using any nasal sprays or antihistamines like Flonase  or Zyrtec.  Her nose runs, which is unusual for her, and she frequently uses tissue. She has been taking Delsym at 7 PM, as it is a 12-hour medication, but often wakes up coughing around 1 or 2 AM, at which point she sometimes takes a swig of Marinell Kipper and honey to alleviate the cough. She does not sleep well in general and takes a sleeping pill at night.      Social History   Tobacco Use  Smoking Status Former   Types: Cigarettes  Smokeless Tobacco Never    Current Outpatient Medications on File Prior to Visit   Medication Sig Dispense Refill   aspirin EC 81 MG tablet Take 81 mg by mouth daily. Taking every other day.     cholecalciferol (VITAMIN D ) 1000 units tablet Take 1,000 Units by mouth daily.     ferrous sulfate 325 (65 FE) MG tablet Take 325 mg by mouth daily with breakfast.     levothyroxine  (SYNTHROID ) 100 MCG tablet TAKE 1 TABLET BY MOUTH ONCE DAILY IN THE MORNING BEFORE BREAKFAST 90 tablet 0   lisinopril -hydrochlorothiazide  (ZESTORETIC ) 20-12.5 MG tablet Take 2 tablets by mouth daily. (Patient taking differently: Take by mouth daily. Patient takes 1 and a half tablets daily.) 180 tablet 3   rosuvastatin  (CRESTOR ) 40 MG tablet Take 1 tablet by mouth once daily 90 tablet 1   zolpidem  (AMBIEN ) 5 MG tablet TAKE 1 TABLET BY MOUTH AT BEDTIME AS NEEDED FOR SLEEP 90 tablet 0   No current facility-administered medications on file prior to visit.    ROS see history of present illness  Objective  Physical Exam Vitals:   05/03/23 1547 05/03/23 1608  BP: (!) 150/70 138/78  Pulse: 74   Resp: 17   Temp: 98.3 F (36.8 C)   SpO2: 95%     BP Readings from Last 3 Encounters:  05/03/23 138/78  12/19/22 118/68  11/28/22 128/82   Wt Readings from Last 3 Encounters:  05/03/23 142 lb 2 oz (64.5 kg)  05/03/23 140 lb (63.5 kg)  12/19/22 143 lb 12.8 oz (65.2 kg)  Physical Exam Constitutional:      General: She is not in acute distress.    Appearance: Normal appearance.  HENT:     Head: Normocephalic.     Right Ear: Tympanic membrane normal.     Left Ear: Tympanic membrane normal.     Nose: Nose normal.     Mouth/Throat:     Mouth: Mucous membranes are moist.     Pharynx: Oropharynx is clear.  Eyes:     Conjunctiva/sclera: Conjunctivae normal.     Pupils: Pupils are equal, round, and reactive to light.  Cardiovascular:     Rate and Rhythm: Normal rate and regular rhythm.     Heart sounds: Normal heart sounds.  Pulmonary:     Effort: Pulmonary effort is normal.     Breath sounds:  Normal breath sounds. No wheezing.  Lymphadenopathy:     Cervical: No cervical adenopathy.  Skin:    General: Skin is warm and dry.  Neurological:     General: No focal deficit present.     Mental Status: She is alert.  Psychiatric:        Mood and Affect: Mood normal.        Behavior: Behavior normal.    Assessment/Plan: Please see individual problem list.  Acute cough Assessment & Plan: A persistent dry cough has lasted for three weeks, worsening at night, without fever, shortness of breath, or chest congestion. Initial sinus congestion and yellow-green nasal discharge have resolved. There was recent exposure to sick individuals at work. Currently using DayQuil and Delsym for symptom management. Discontinue Delsym and start Bromfed DM cough syrup, beginning with a 5ml dose to assess tolerance. Start Prednisone  for 5 days to help with the cough. Provide an Albuterol  inhaler for use at night during coughing fits. Counseled on common side effects. Report any changes in symptoms, particularly increased chest congestion or production of thick sputum, which may indicate the need for antibiotics. Advised adequate hydration.   Orders: -     predniSONE ; Take 2 tablets (40 mg total) by mouth daily with breakfast.  Dispense: 10 tablet; Refill: 0 -     Albuterol  Sulfate HFA; Inhale 2 puffs into the lungs every 6 (six) hours as needed for wheezing or shortness of breath.  Dispense: 8 g; Refill: 0 -     Pseudoeph-Bromphen-DM; Take 5-10 mLs by mouth every 6 (six) hours as needed.  Dispense: 120 mL; Refill: 0  Primary hypertension Assessment & Plan: Blood pressure was elevated during the visit, possibly due to recent use of cough medicine. Improvement noted on recheck. Continue current medication regimen.     Return if symptoms worsen or fail to improve.   Leron Glance, NP-C [facility]

## 2023-05-03 NOTE — Assessment & Plan Note (Addendum)
 A persistent dry cough has lasted for three weeks, worsening at night, without fever, shortness of breath, or chest congestion. Initial sinus congestion and yellow-green nasal discharge have resolved. There was recent exposure to sick individuals at work. Currently using DayQuil and Delsym for symptom management. Discontinue Delsym and start Bromfed DM cough syrup, beginning with a 5ml dose to assess tolerance. Start Prednisone  for 5 days to help with the cough. Provide an Albuterol  inhaler for use at night during coughing fits. Counseled on common side effects. Report any changes in symptoms, particularly increased chest congestion or production of thick sputum, which may indicate the need for antibiotics. Advised adequate hydration.

## 2023-05-03 NOTE — Progress Notes (Signed)
 Subjective:   Tammy Dalton is a 78 y.o. female who presents for Medicare Annual (Subsequent) preventive examination.  Visit Complete: Virtual I connected with  Tammy Dalton on 05/03/23 by a audio enabled telemedicine application and verified that I am speaking with the correct person using two identifiers. This patient declined Interactive audio and acupuncturist. Therefore the visit was completed with audio only.   Patient Location: Home  Provider Location: Home Office  I discussed the limitations of evaluation and management by telemedicine. The patient expressed understanding and agreed to proceed.  Vital Signs: Because this visit was a virtual/telehealth visit, some criteria may be missing or patient reported. Any vitals not documented were not able to be obtained and vitals that have been documented are patient reported.  Cardiac Risk Factors include: advanced age (>92men, >23 women);dyslipidemia;hypertension     Objective:    Today's Vitals   05/03/23 1132  Weight: 140 lb (63.5 kg)  Height: 5' 2 (1.575 m)   Body mass index is 25.61 kg/m.     05/03/2023   11:42 AM 04/27/2022   11:09 AM 04/14/2021   11:23 AM 04/07/2020   11:37 AM 04/02/2019   11:41 AM 01/13/2018    3:24 PM 04/13/2017    3:20 PM  Advanced Directives  Does Patient Have a Medical Advance Directive? Yes Yes Yes Yes Yes Yes No  Type of Estate Agent of [place];Living will Healthcare Power of [place];Living will Healthcare Power of [place];Living will Healthcare Power of [place];Living will Healthcare Power of [place];Living will Healthcare Power of [place];Living will   Does patient want to make changes to medical advance directive?  No - Patient declined No - Patient declined No - Patient declined No - Patient declined No - Patient declined   Copy of Healthcare Power of Attorney in Chart? No - copy requested No - copy requested No - copy requested No - copy requested No -  copy requested No - copy requested   Would patient like information on creating a medical advance directive?       No - Patient declined    Current Medications (verified) Outpatient Encounter Medications as of 05/03/2023  Medication Sig   aspirin EC 81 MG tablet Take 81 mg by mouth daily. Taking every other day.   cholecalciferol (VITAMIN D ) 1000 units tablet Take 1,000 Units by mouth daily.   ferrous sulfate 325 (65 FE) MG tablet Take 325 mg by mouth daily with breakfast.   levothyroxine  (SYNTHROID ) 100 MCG tablet TAKE 1 TABLET BY MOUTH ONCE DAILY IN THE MORNING BEFORE BREAKFAST   lisinopril -hydrochlorothiazide  (ZESTORETIC ) 20-12.5 MG tablet Take 2 tablets by mouth daily. (Patient taking differently: Take by mouth daily. Patient takes 1 and a half tablets daily.)   rosuvastatin  (CRESTOR ) 40 MG tablet Take 1 tablet by mouth once daily   zolpidem  (AMBIEN ) 5 MG tablet TAKE 1 TABLET BY MOUTH AT BEDTIME AS NEEDED FOR SLEEP   No facility-administered encounter medications on file as of 05/03/2023.    Allergies (verified) Codeine   History: Past Medical History:  Diagnosis Date   Hyperlipidemia    Hypertension    Thyroid  disease    Past Surgical History:  Procedure Laterality Date   anal sphincterotomy and fissurectomy  2009   COLONOSCOPY  05/24/2011   Dr. Denyse Elam done at [facility]   COLONOSCOPY  05/20/2000   Dr. Sankar; Sharp bend at 50 cm. Scope could not be advanced any further even with glucagon.  COLONOSCOPY WITH PROPOFOL  N/A 04/24/2016   Procedure: COLONOSCOPY WITH PROPOFOL ;  Surgeon: Louanne KANDICE Muse, MD;  Location: [facility] ENDOSCOPY;  Service: Endoscopy;  Laterality: N/A;   EYE SURGERY Bilateral 2010   cataract surgery.    skin lesion removed  2025   TONSILLECTOMY     Family History  Problem Relation Age of Onset   Depression Mother    Heart disease Father    Early death Sister    Alcohol abuse Brother    Stroke Paternal Grandfather     Social History   Socioeconomic History   Marital status: Single    Spouse name: Not on file   Number of children: Not on file   Years of education: Not on file   Highest education level: Not on file  Occupational History   Not on file  Tobacco Use   Smoking status: Former    Types: Cigarettes   Smokeless tobacco: Never  Vaping Use   Vaping status: Never Used  Substance and Sexual Activity   Alcohol use: No   Drug use: No   Sexual activity: Never  Other Topics Concern   Not on file  Social History Narrative   Not on file   Social Drivers of Health   Financial Resource Strain: Low Risk  (05/03/2023)   Overall Financial Resource Strain (CARDIA)    Difficulty of Paying Living Expenses: Not hard at all  Food Insecurity: No Food Insecurity (05/03/2023)   Hunger Vital Sign    Worried About Running Out of Food in the Last Year: Never true    Ran Out of Food in the Last Year: Never true  Transportation Needs: No Transportation Needs (05/03/2023)   PRAPARE - Administrator, Civil Service (Medical): No    Lack of Transportation (Non-Medical): No  Physical Activity: Sufficiently Active (05/03/2023)   Exercise Vital Sign    Days of Exercise per Week: 3 days    Minutes of Exercise per Session: 60 min  Stress: No Stress Concern Present (05/03/2023)   [employer] of Occupational Health - Occupational Stress Questionnaire    Feeling of Stress : Only a little  Social Connections: Moderately Integrated (05/03/2023)   Social Connection and Isolation Panel [NHANES]    Frequency of Communication with Friends and Family: More than three times a week    Frequency of Social Gatherings with Friends and Family: More than three times a week    Attends Religious Services: Never    Database Administrator or Organizations: Yes    Attends Engineer, Structural: More than 4 times per year    Marital Status: Living with partner    Tobacco Counseling Counseling given: Not  Answered   Clinical Intake:  Pre-visit preparation completed: Yes  Pain : No/denies pain     BMI - recorded: 25.61 Nutritional Status: BMI 25 -29 Overweight Nutritional Risks: None Diabetes: No  How often do you need to have someone help you when you read instructions, pamphlets, or other written materials from your doctor or pharmacy?: 1 - Never  Interpreter Needed?: No  Information entered by :: R. Avier Jech LPN   Activities of Daily Living    05/03/2023   11:33 AM  In your present state of health, do you have any difficulty performing the following activities:  Hearing? 0  Vision? 0  Comment readers  Difficulty concentrating or making decisions? 0  Walking or climbing stairs? 0  Dressing or bathing? 0  Doing errands, shopping? 0  Preparing Food and eating ? N  Using the Toilet? N  In the past six months, have you accidently leaked urine? N  Do you have problems with loss of bowel control? N  Managing your Medications? N  Managing your Finances? N  Housekeeping or managing your Housekeeping? N    Patient Care Team: Maribeth Camellia MATSU, MD as PCP - General (Family Medicine) Dellie Louanne MATSU, MD (General Surgery)  Indicate any recent Medical Services you may have received from other than [facility] providers in the past year (date may be approximate).     Assessment:   This is a routine wellness examination for Tammy Dalton.  Hearing/Vision screen Hearing Screening - Comments:: No issues Vision Screening - Comments:: readers   Goals Addressed             This Visit's Progress    Patient Stated       Wants to lose some weight       Depression Screen    05/03/2023   11:37 AM 12/19/2022    8:37 AM 11/28/2022    3:01 PM 08/27/2022    3:39 PM 05/28/2022    3:45 PM 04/27/2022   11:08 AM 11/22/2021    3:39 PM  PHQ 2/9 Scores  PHQ - 2 Score 0 0 0 0 0 0 0  PHQ- 9 Score 3 0 1 0 2      Fall Risk    05/03/2023   11:34 AM 12/19/2022    8:37 AM 11/28/2022    3:01 PM  08/27/2022    3:39 PM 05/28/2022    3:45 PM  Fall Risk   Falls in the past year? 0 0 0 0 0  Number falls in past yr: 0 0 0 0 0  Injury with Fall? 0 0 0 0 0  Risk for fall due to : No Fall Risks No Fall Risks No Fall Risks No Fall Risks No Fall Risks  Follow up Falls prevention discussed;Falls evaluation completed Falls evaluation completed Falls evaluation completed Falls evaluation completed Falls evaluation completed    MEDICARE RISK AT HOME: Medicare Risk at Home Any stairs in or around the home?: Yes If so, are there any without handrails?: No Home free of loose throw rugs in walkways, pet beds, electrical cords, etc?: Yes Adequate lighting in your home to reduce risk of falls?: Yes Life alert?: No Use of a cane, walker or w/c?: No Grab bars in the bathroom?: No Shower chair or bench in shower?: No Elevated toilet seat or a handicapped toilet?: Yes  Cognitive Function:    01/13/2018    3:25 PM 12/24/2016    4:34 PM  MMSE - Mini Mental State Exam  Orientation to time 5 5  Orientation to Place 5 5  Registration 3 3  Attention/ Calculation 5 5  Recall 3 3  Language- name 2 objects 2 2  Language- repeat 1 1  Language- follow 3 step command 3 3  Language- read & follow direction 1 1  Write a sentence 1 1  Copy design 1 1  Total score 30 30        05/03/2023   11:42 AM 04/27/2022   11:30 AM  6CIT Screen  What Year? 0 points 0 points  What month? 0 points 0 points  What time? 0 points 0 points  Count back from 20 0 points 0 points  Months in reverse 0 points 0 points  Repeat phrase 0 points 0 points  Total Score 0  points 0 points    Immunizations Immunization History  Administered Date(s) Administered   Influenza Nasal 12/16/2020   Influenza Split 12/04/2021   Influenza, High Dose Seasonal PF 12/24/2016, 12/02/2017, 12/15/2018, 12/28/2022   Influenza-Unspecified 11/25/2015, 12/02/2017, 12/18/2019   PFIZER(Purple Top)SARS-COV-2 Vaccination 05/01/2019, 05/22/2019,  01/07/2020, 08/12/2020   Pfizer Covid-19 Vaccine Bivalent Booster 58yrs & up 01/03/2021   Pfizer(Comirnaty)Fall Seasonal Vaccine 12 years and older 03/01/2022, 11/30/2022   Pneumococcal Conjugate-13 06/28/2017   Pneumococcal Polysaccharide-23 06/30/2018   Pneumococcal-Unspecified 05/25/2011   Rsv, Bivalent, Protein Subunit Rsvpref,pf [place]) 12/04/2021   Tdap 08/01/2015, 04/13/2017   Zoster Recombinant(Shingrix) 06/13/2017, 09/13/2017   Zoster, Live 03/27/2007    TDAP status: Up to date  Flu Vaccine status: Up to date  Pneumococcal vaccine status: Up to date  Covid-19 vaccine status: Completed vaccines  Qualifies for Shingles Vaccine? Yes   Zostavax completed Yes   Shingrix Completed?: Yes  Screening Tests Health Maintenance  Topic Date Due   COVID-19 Vaccine (8 - 2024-25 season) 01/25/2023   Medicare Annual Wellness (AWV)  04/28/2023   DTaP/Tdap/Td (3 - Td or Tdap) 04/14/2027   Pneumonia Vaccine 37+ Years old  Completed   INFLUENZA VACCINE  Completed   DEXA SCAN  Completed   Hepatitis C Screening  Completed   Zoster Vaccines- Shingrix  Completed   HPV VACCINES  Aged Out   Colonoscopy  Discontinued    Health Maintenance  Health Maintenance Due  Topic Date Due   COVID-19 Vaccine (8 - 2024-25 season) 01/25/2023   Medicare Annual Wellness (AWV)  04/28/2023    Colorectal cancer screening: No longer required.   Mammogram status: Completed 01/2022. Repeat every year Patient wants to have every two years  Bone Density status: Completed 11/2022. Results reflect: Bone density results: OSTEOPENIA. Repeat every 2 years.  Lung Cancer Screening: (Low Dose CT Chest recommended if Age 33-80 years, 20 pack-year currently smoking OR have quit w/in 15years.) does not qualify.     Additional Screening:  Hepatitis C Screening: does qualify; Completed 06/2016  Vision Screening: Recommended annual ophthalmology exams for early detection of glaucoma and other disorders of the  eye. Is the patient up to date with their annual eye exam?  Yes  Who is the provider or what is the name of the office in which the patient attends annual eye exams? [facility] If pt is not established with a provider, would they like to be referred to a provider to establish care? No .     Community Resource Referral / Chronic Care Management: CRR required this visit?  No   CCM required this visit?  No     Plan:     I have personally reviewed and noted the following in the patient's chart:   Medical and social history Use of alcohol, tobacco or illicit drugs  Current medications and supplements including opioid prescriptions. Patient is not currently taking opioid prescriptions. Functional ability and status Nutritional status Physical activity Advanced directives List of other physicians Hospitalizations, surgeries, and ER visits in previous 12 months Vitals Screenings to include cognitive, depression, and falls Referrals and appointments  In addition, I have reviewed and discussed with patient certain preventive protocols, quality metrics, and best practice recommendations. A written personalized care plan for preventive services as well as general preventive health recommendations were provided to patient.     Angeline Fredericks, LPN   09/24/7972   After Visit Summary: (MyChart) Due to this being a telephonic visit, the after visit summary with patients personalized  plan was offered to patient via MyChart   Nurse Notes: Patient has had a cough for about 3 weeks. Patient scheduled for an appointment at the office today at 4:00.

## 2023-05-03 NOTE — Patient Instructions (Signed)
 Tammy Dalton , Thank you for taking time to come for your Medicare Wellness Visit. I appreciate your ongoing commitment to your health goals. Please review the following plan we discussed and let me know if I can assist you in the future.   Referrals/Orders/Follow-Ups/Clinician Recommendations: None  This is a list of the screening recommended for you and due dates:  Health Maintenance  Topic Date Due   COVID-19 Vaccine (8 - 2024-25 season) 01/25/2023   Mammogram  02/06/2024   Medicare Annual Wellness Visit  05/02/2024   DTaP/Tdap/Td vaccine (3 - Td or Tdap) 04/14/2027   Pneumonia Vaccine  Completed   Flu Shot  Completed   DEXA scan (bone density measurement)  Completed   Hepatitis C Screening  Completed   Zoster (Shingles) Vaccine  Completed   HPV Vaccine  Aged Out   Colon Cancer Screening  Discontinued    Advanced directives: (Copy Requested) Please bring a copy of your health care power of attorney and living will to the office to be added to your chart at your convenience.  Next Medicare Annual Wellness Visit scheduled for next year: Yes 05/08/24 @ 11:30

## 2023-05-03 NOTE — Assessment & Plan Note (Signed)
 Blood pressure was elevated during the visit, possibly due to recent use of cough medicine. Improvement noted on recheck. Continue current medication regimen.

## 2023-05-29 ENCOUNTER — Ambulatory Visit: Payer: Medicare Other | Admitting: Family Medicine

## 2023-05-31 ENCOUNTER — Encounter: Payer: Medicare Other | Admitting: Family Medicine

## 2023-06-10 ENCOUNTER — Other Ambulatory Visit: Payer: Self-pay | Admitting: Family Medicine

## 2023-06-10 DIAGNOSIS — G4700 Insomnia, unspecified: Secondary | ICD-10-CM

## 2023-06-10 NOTE — Telephone Encounter (Signed)
 Copied from CRM [id number]. Topic: Clinical - Medication Refill >> Jun 10, 2023  9:27 AM Lorin Glass B wrote: Most Recent Primary Care Visit:  Provider: Bethanie Dicker  Department: [facility]  Visit Type: OFFICE VISIT  Date: 05/03/2023  Medication: zolpidem (AMBIEN) 5 MG tablet  Has the patient contacted their pharmacy? Yes, states pharmacy sent fax but no response (Agent: If no, request that the patient contact the pharmacy for the refill. If patient does not wish to contact the pharmacy document the reason why and proceed with request.) (Agent: If yes, when and what did the pharmacy advise?)  Is this the correct pharmacy for this prescription? Yes If no, delete pharmacy and type the correct one.  This is the patient's preferred pharmacy:   [facility] [address] [place] - [address] [place] [address] Phone: [phone] Fax: [phone]   Has the prescription been filled recently? No  Is the patient out of the medication? No, 4 left  Has the patient been seen for an appointment in the last year OR does the patient have an upcoming appointment? Yes  Can we respond through MyChart? Yes  Agent: Please be advised that Rx refills may take up to 3 business days. We ask that you follow-up with your pharmacy.

## 2023-06-11 MED ORDER — ZOLPIDEM TARTRATE 5 MG PO TABS: 5.0000 mg | ORAL_TABLET | Freq: Every evening | ORAL | 0 refills | Status: DC | PRN

## 2023-06-20 ENCOUNTER — Ambulatory Visit: Admitting: Family Medicine

## 2023-06-20 ENCOUNTER — Ambulatory Visit: Payer: Self-pay

## 2023-06-20 ENCOUNTER — Encounter: Payer: Self-pay | Admitting: Family Medicine

## 2023-06-20 VITALS — BP 122/62 | HR 81 | Temp 98.1°F | Resp 20 | Ht 62.0 in | Wt 141.1 lb

## 2023-06-20 DIAGNOSIS — M109 Gout, unspecified: Secondary | ICD-10-CM | POA: Diagnosis not present

## 2023-06-20 LAB — COMPREHENSIVE METABOLIC PANEL WITH GFR
ALT: 16 U/L (ref 0–35)
AST: 19 U/L (ref 0–37)
Albumin: 4.3 g/dL (ref 3.5–5.2)
Alkaline Phosphatase: 66 U/L (ref 39–117)
BUN: 15 mg/dL (ref 6–23)
CO2: 30 meq/L (ref 19–32)
Calcium: 9.4 mg/dL (ref 8.4–10.5)
Chloride: 105 meq/L (ref 96–112)
Creatinine, Ser: 1.17 mg/dL (ref 0.40–1.20)
GFR: 45.3 mL/min — ABNORMAL LOW (ref >60.00)
Glucose, Bld: 93 mg/dL (ref 70–99)
Potassium: 4 meq/L (ref 3.5–5.1)
Sodium: 143 meq/L (ref 135–145)
Total Bilirubin: 0.7 mg/dL (ref 0.2–1.2)
Total Protein: 6.3 g/dL (ref 6.0–8.3)

## 2023-06-20 LAB — CBC WITH DIFFERENTIAL/PLATELET
Basophils Absolute: 0 10*3/uL (ref 0.0–0.1)
Basophils Relative: 0.6 % (ref 0.0–3.0)
Eosinophils Absolute: 0.3 10*3/uL (ref 0.0–0.7)
Eosinophils Relative: 5.7 % — ABNORMAL HIGH (ref 0.0–5.0)
HCT: 34.8 % — ABNORMAL LOW (ref 36.0–46.0)
Hemoglobin: 11.5 g/dL — ABNORMAL LOW (ref 12.0–15.0)
Lymphocytes Relative: 24.2 % (ref 12.0–46.0)
Lymphs Abs: 1.3 10*3/uL (ref 0.7–4.0)
MCHC: 33 g/dL (ref 30.0–36.0)
MCV: 87.3 fl (ref 78.0–100.0)
Monocytes Absolute: 0.6 10*3/uL (ref 0.1–1.0)
Monocytes Relative: 11.8 % (ref 3.0–12.0)
Neutro Abs: 3 10*3/uL (ref 1.4–7.7)
Neutrophils Relative %: 57.7 % (ref 43.0–77.0)
Platelets: 181 10*3/uL (ref 150.0–400.0)
RBC: 3.98 Mil/uL (ref 3.87–5.11)
RDW: 16.4 % — ABNORMAL HIGH (ref 11.5–15.5)
WBC: 5.2 10*3/uL (ref 4.0–10.5)

## 2023-06-20 LAB — URIC ACID: Uric Acid, Serum: 6.5 mg/dL (ref 2.4–7.0)

## 2023-06-20 MED ORDER — COLCHICINE 0.6 MG PO TABS: 0.6000 mg | ORAL_TABLET | Freq: Every day | ORAL | 0 refills | Status: DC

## 2023-06-20 NOTE — Telephone Encounter (Signed)
 Copied from CRM [id number]. Topic: Clinical - Red Word Triage >> Jun 20, 2023  8:06 AM Gurney Maxin H wrote: [facility] that prompted transfer to Nurse Triage: Left big toe is red, swollen and painful, limping can't put pressure on it or barely touch it.  Chief Complaint: foot pain Symptoms: left big toe 10/10 pain Frequency: yesterday Pertinent Negatives: Patient denies numbness and tingling Disposition: [] ED /[] Urgent Care (no appt availability in office) / [x] Appointment(In office/virtual)/ []  [place] [facility]/ [] Home Care/ [] Refused Recommended Disposition /[] [place] [facility]/ []  Follow-up with PCP Additional Notes:pt states left big toe is swollen, painful to touch, not able to apply pressure to area, area is hot to touch.  pt thinks she has gout: has taken ibuprofen and applied ice to area. Scheduled in office appt today.  Reason for Disposition  [1] Swollen foot AND [2] no fever  (Exceptions: localized bump from bunions, calluses, insect bite, sting)    10/10 Painful Left big toe w/ swelling and not able to apply pressure to area  Answer Assessment - Initial Assessment Questions 1. ONSET: "When did the pain start?"      yesterday 2. LOCATION: "Where is the pain located?"      Left big toe 3. PAIN: "How bad is the pain?"    (Scale 1-10; or mild, moderate, severe)  - MILD (1-3): doesn't interfere with normal activities.   - MODERATE (4-7): interferes with normal activities (e.g., work or school) or awakens from sleep, limping.   - SEVERE (8-10): excruciating pain, unable to do any normal activities, unable to walk.      10/10 4. WORK OR EXERCISE: "Has there been any recent work or exercise that involved this part of the body?"      no 5. CAUSE: "What do you think is causing the foot pain?"     Joint of left great toe - possible gout 6. OTHER SYMPTOMS: "Do you have any other symptoms?" (e.g., leg pain, rash, fever, numbness)     Left big toe is hot to touch, moderate  swelling 7. PREGNANCY: "Is there any chance you are pregnant?" "When was your last menstrual period?"     N/a  Protocols used: Foot Pain-A-AH

## 2023-06-20 NOTE — Patient Instructions (Addendum)
 It was a pleasure meeting you today. Thank you for allowing me to take part in your health care.  Our goals for today as we discussed include:  We will get some labs today.  If they are abnormal or we need to do something about them, I will call you.  If they are normal, I will send you a message on MyChart (if it is active) or a letter in the mail.  If you don't hear from [place] in 2 weeks, please call the office at the number below.    Follow these directions and not the directions on the pill label Take Colchicine 0.6 mg, 2 tablets today then 1 tablet 1 hour after Tomorrow take 0.6 mg 1-2 times a day until flare up resolves.   If no improvement on day three notify MD   This is a list of the screening recommended for you and due dates:  Health Maintenance  Topic Date Due   COVID-19 Vaccine (8 - Pfizer risk 2024-25 season) 05/30/2023   Mammogram  02/06/2024   Medicare Annual Wellness Visit  05/02/2024   DTaP/Tdap/Td vaccine (5 - Td or Tdap) 11/08/2032   Pneumonia Vaccine  Completed   Flu Shot  Completed   DEXA scan (bone density measurement)  Completed   Hepatitis C Screening  Completed   Zoster (Shingles) Vaccine  Completed   HPV Vaccine  Aged Out   Colon Cancer Screening  Discontinued      If you have any questions or concerns, please do not hesitate to call the office at [phone].  I look forward to our next visit and until then take care and stay safe.  Regards,   Dana Allan, MD   [facility]

## 2023-06-20 NOTE — Progress Notes (Signed)
 SUBJECTIVE:   Chief Complaint  Patient presents with   Gout    Left big toe X yesterday   HPI Presents for acute visit  Discussed the use of AI scribe software for clinical note transcription with the patient, who gave verbal consent to proceed.  History of Present Illness Tammy SUNDBERG "[name]" is a 78 year old female who presents with acute right great toe pain and swelling.  Tammy Dalton woke up yesterday morning with soreness in her right great toe. Tammy Dalton works on her feet all day and limped around due to the pain. Upon returning home, the toe was red, sore to touch, and 'just terrible.' No trauma, injury, or cuts to the toe. No fever or chills. This is her first episode of such symptoms.  Tammy Dalton self-diagnosed with gout after researching online and began treatment with ibuprofen, ice, and elevation, which provided some relief by the next morning. Tammy Dalton is currently taking ibuprofen for the pain, despite previous advice from another doctor to avoid it due to her thyroid condition.  There is a family history of gout in her father and grandmother. Tammy Dalton recalls a previous high uric acid level of 7.6, though Tammy Dalton cannot remember when it was last checked.  In terms of diet, Tammy Dalton sometimes eats red meat and recently had lasagna and a hamburger, but Tammy Dalton does not believe her eating habits have changed significantly. Tammy Dalton denies alcohol consumption. Tammy Dalton has been consuming more chicken salad lately.    PERTINENT PMH / PSH: As above  OBJECTIVE:  BP 122/62   Pulse 81   Temp 98.1 F (36.7 C)   Resp 20   Ht 5\' 2"  (1.575 m)   Wt 141 lb 2 oz (64 kg)   SpO2 98%   BMI 25.81 kg/m    Physical Exam Vitals reviewed.  Constitutional:      General: Tammy Dalton is not in acute distress.    Appearance: Normal appearance. Tammy Dalton is normal weight. Tammy Dalton is not ill-appearing, toxic-appearing or diaphoretic.  Eyes:     General:        Right eye: No discharge.        Left eye: No discharge.     Conjunctiva/sclera:  Conjunctivae normal.  Cardiovascular:     Rate and Rhythm: Normal rate.     Pulses:          Dorsalis pedis pulses are 2+ on the left side.       Posterior tibial pulses are 2+ on the left side.  Pulmonary:     Effort: Pulmonary effort is normal.  Musculoskeletal:        General: Normal range of motion.     Left foot: Normal range of motion and normal capillary refill. No swelling. Normal pulse.       Feet:  Feet:     Left foot:     Skin integrity: Erythema and warmth present.     Toenail Condition: Left toenails are normal.     Comments: Left Hallus tender to touch, limited ROM secondary to pain.  Slightly erythematous without significant warmth or edema.  Cap refill normal Skin:    General: Skin is warm and dry.  Neurological:     General: No focal deficit present.     Mental Status: Tammy Dalton is alert and oriented to person, place, and time. Mental status is at baseline.  Psychiatric:        Mood and Affect: Mood normal.        Behavior: Behavior  normal.        Thought Content: Thought content normal.        Judgment: Judgment normal.           06/20/2023    9:37 AM 05/03/2023   11:37 AM 12/19/2022    8:37 AM 11/28/2022    3:01 PM 08/27/2022    3:39 PM  Depression screen PHQ 2/9  Decreased Interest 0 0 0 0 0  Down, Depressed, Hopeless 0 0 0 0 0  PHQ - 2 Score 0 0 0 0 0  Altered sleeping 3 3 0 1 0  Tired, decreased energy 0 0 0 0 0  Change in appetite 0 0 0 0 0  Feeling bad or failure about yourself  0 0 0 0 0  Trouble concentrating 0 0 0 0 0  Moving slowly or fidgety/restless 0 0 0 0 0  Suicidal thoughts 0 0 0 0 0  PHQ-9 Score 3 3 0 1 0  Difficult doing work/chores Not difficult at all Not difficult at all Not difficult at all Not difficult at all Not difficult at all      06/20/2023    9:37 AM 12/19/2022    8:37 AM 11/28/2022    3:01 PM 08/27/2022    3:39 PM  GAD 7 : Generalized Anxiety Score  Nervous, Anxious, on Edge 0 0 0 0  Control/stop worrying 2 0 1 0  Worry too  much - different things 2 0 1 0  Trouble relaxing 2 0 0 0  Restless 0 0 0 0  Easily annoyed or irritable 2 0 0 0  Afraid - awful might happen 0 0 1 0  Total GAD 7 Score 8 0 3 0  Anxiety Difficulty Somewhat difficult Not difficult at all Not difficult at all Not difficult at all    ASSESSMENT/PLAN:  Acute gout, unspecified cause, unspecified site Assessment & Plan: 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 Colchicine prescribed for acute phase. Allopurinol considered for future prophylaxis if recurrent flare-ups occur. Has had previous elevated uric acid level in past with symptoms of elbow pain. - Check uric acid level. - Prescribe colchicine 0.6 mg tablets with specific dosing instructions. - Advise to avoid red meat and alcohol during acute phase. - Instruct to elevate and ice affected foot. - Monitor symptoms and call if unable to work on Monday. - Consider allopurinol for uric acid level management if recurrent flare-ups occur.  Orders: -     Uric acid -     Colchicine; Take 1 tablet (0.6 mg total) by mouth daily.  Dispense: 60 tablet; Refill: 0 -     CBC with Differential/Platelet -     Comprehensive metabolic panel with GFR   PDMP reviewed  Return if symptoms worsen or fail to improve, for PCP.  Dana Allan, MD

## 2023-06-24 ENCOUNTER — Telehealth: Payer: Self-pay

## 2023-06-24 NOTE — Telephone Encounter (Signed)
 Copied from CRM [id number]. Topic: Clinical - Medication Question >> Jun 24, 2023 10:28 AM Sim Boast F wrote: Reason for CRM: Patient called to inform Dr. Clent Ridges that she took the colchicine for 2 days and it cleared up the gout, wants to know if it was okay that she stopped taking it?

## 2023-06-24 NOTE — Telephone Encounter (Signed)
 Spoke with patient to make her aware of Dr Claris Che recommendations. Patient verbalized understanding and is asking if the provider has had a chance to review her labs to move forward with the medication that she would prescribed during patient's appointment.

## 2023-06-24 NOTE — Telephone Encounter (Signed)
 Copied from CRM [id number]. Topic: Clinical - Lab/Test Results >> Jun 24, 2023 10:27 AM Sim Boast F wrote: Reason for CRM: Patient requested call back regarding lab results, says Dr. Clent Ridges was going to prescribe a medication depending on what the results were

## 2023-06-26 ENCOUNTER — Encounter: Payer: Self-pay | Admitting: Family Medicine

## 2023-06-26 DIAGNOSIS — M109 Gout, unspecified: Secondary | ICD-10-CM | POA: Insufficient documentation

## 2023-06-26 NOTE — Assessment & Plan Note (Addendum)
 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 Colchicine prescribed for acute phase. Allopurinol considered for future prophylaxis if recurrent flare-ups occur. Has had previous elevated uric acid level in past with symptoms of elbow pain. - Check uric acid level. - Prescribe colchicine 0.6 mg tablets with specific dosing instructions. - Advise to avoid red meat and alcohol during acute phase. - Instruct to elevate and ice affected foot. - Monitor symptoms and call if unable to work on Monday. - Consider allopurinol for uric acid level management if recurrent flare-ups occur.

## 2023-07-09 ENCOUNTER — Telehealth: Payer: Self-pay

## 2023-07-09 NOTE — Telephone Encounter (Signed)
 Spoke with pt and scheduled her for an appt with Dr. Narendra tomorrow.

## 2023-07-09 NOTE — Telephone Encounter (Signed)
 Copied from CRM [id number]. Topic: Clinical - Medical Advice >> Jul 09, 2023  8:10 AM Tammy Dalton wrote: Reason for CRM: pt called to speak with nurse regarding sore toe, please call  pt back at [phone]

## 2023-07-09 NOTE — Telephone Encounter (Signed)
 Copied from CRM [id number]. Topic: Clinical - Medical Advice >> Jul 09, 2023  8:10 AM Earnestine Goes B wrote: Reason for CRM: pt called to speak with nurse regarding sore toe, please call  pt back at [phone]

## 2023-07-10 ENCOUNTER — Encounter: Payer: Self-pay | Admitting: Internal Medicine

## 2023-07-10 ENCOUNTER — Ambulatory Visit: Admitting: Internal Medicine

## 2023-07-10 VITALS — BP 122/84 | HR 80 | Temp 98.8°F | Ht 62.0 in | Wt 138.6 lb

## 2023-07-10 DIAGNOSIS — M10072 Idiopathic gout, left ankle and foot: Secondary | ICD-10-CM | POA: Diagnosis not present

## 2023-07-10 DIAGNOSIS — I10 Essential (primary) hypertension: Secondary | ICD-10-CM

## 2023-07-10 DIAGNOSIS — L84 Corns and callosities: Secondary | ICD-10-CM | POA: Diagnosis not present

## 2023-07-10 NOTE — Patient Instructions (Addendum)
-   It was a pleasure meeting you today -I do not think that you have a current gout flare.  However, if you do notice increased swelling and pain in your toe please come back and follow-up with us . -There is no indication for preventative gout medication at this time -I have attached a handout with diet recommendations for gout -I also attached a handout on calluses.  You can use an over-the-counter topical salicylic acid patch to help with the callus (like Mediplast or Dr. Randon Butters).  Please use footwear with appropriate cushioning and space (wide toed) -Please call us  with any questions or concerns or if your symptoms worsen

## 2023-07-10 NOTE — Assessment & Plan Note (Signed)
-   This problem is chronic and stable -Patient states that she is on 1-1/2 pills of lisinopril/HCTZ (20/12.5 mg) daily -Her blood pressure today is 122/84 -If patient does have recurrent gout flares we may need to transition her off the hydrochlorothiazide

## 2023-07-10 NOTE — Assessment & Plan Note (Signed)
-   Patient noted to have a callus with mild tenderness over ventral aspect of left big toe with mild tenderness to palpation over the callus -I suspect this is the reason for her shortness in her left toe -I have advised her to wear appropriate shoes with cushioning that are wide toed -I also advised topical salicylic acid patches that she can get over-the-counter -If this does not improve she may benefit from referral to podiatry

## 2023-07-10 NOTE — Progress Notes (Signed)
 Acute Office Visit  Subjective:     Patient ID: Tammy Dalton, female    DOB: Sep 08, 1945, 78 y.o.   MRN: [id number]  Chief Complaint  Patient presents with   Acute Visit    Left big toe possible gout flare    HPI Patient is in today for left toe pain.  Patient states that approximately 2 weeks ago she developed significant swelling and redness and pain in her left big toe.  She was seen in our clinic and prescribed colchicine.  She states that she took 3 pills of colchicine on the first day and then 3 more on the second with improvement in her symptoms.  However, patient states that since then she still has had some soreness in her left foot and is on her feet a lot for work as well as when she plays golf.  She denies any redness or swelling currently.  She also states that her soreness has improved since yesterday.  Review of Systems  Constitutional: Negative.   HENT: Negative.    Respiratory: Negative.    Cardiovascular: Negative.   Musculoskeletal:        Pain on the medial aspect of her left foot near the big toe  Psychiatric/Behavioral: Negative.          Objective:    BP 122/84   Pulse 80   Temp 98.8 F (37.1 C)   Ht 5\' 2"  (1.575 m)   Wt 138 lb 9.6 oz (62.9 kg)   SpO2 96%   BMI 25.35 kg/m    Physical Exam Constitutional:      Appearance: Normal appearance.  HENT:     Head: Normocephalic and atraumatic.  Cardiovascular:     Rate and Rhythm: Normal rate and regular rhythm.     Heart sounds: Normal heart sounds.  Pulmonary:     Effort: Pulmonary effort is normal.     Breath sounds: Normal breath sounds. No wheezing or rales.  Musculoskeletal:        General: No swelling.     Comments: Patient with no warmth or erythema noted over her left big toe.  She does have some mild tenderness on the medial aspect of her first MTP joint.  She was noted to have a callus in the area and tenderness appears to be over the area of the callus.  Neurological:     Mental  Status: She is alert and oriented to person, place, and time.  Psychiatric:        Mood and Affect: Mood normal.        Behavior: Behavior normal.     No results found for any visits on 07/10/23.      Assessment & Plan:   Problem List Items Addressed This Visit       Cardiovascular and Mediastinum   Hypertension - Primary (Chronic)   - This problem is chronic and stable -Patient states that she is on 1-1/2 pills of lisinopril/HCTZ (20/12.5 mg) daily -Her blood pressure today is 122/84 -If patient does have recurrent gout flares we may need to transition her off the hydrochlorothiazide        Musculoskeletal and Integument   Callus of toe   - Patient noted to have a callus with mild tenderness over ventral aspect of left big toe with mild tenderness to palpation over the callus -I suspect this is the reason for her shortness in her left toe -I have advised her to wear appropriate shoes with cushioning that  are wide toed -I also advised topical salicylic acid patches that she can get over-the-counter -If this does not improve she may benefit from referral to podiatry        Other   Acute gout   - Patient in acute gout flare approximately 2 weeks ago -She was seen in our clinic and prescribed colchicine and took 3 pills on the first day and 3 pills on the second with improvement of the swelling and tenderness -She has not required further colchicine -She has been avoiding red meat and does not drink alcohol -She does complain of some persistent soreness in that left toe -On exam, she has no erythema or swelling or increased local warmth but does have some mild tenderness over the medial aspect of her first MTP joint at the site of her callus -No indication for gout treatment at this time -Patient did have a normal uric acid level at the time of her prior gout flare but during acute gout flare uric acid could be elevated or normal -No indication for gout prophylaxis given  that she has had only 1 flare.  However, if she does have a recurrent flare she would benefit from treatment with allopurinol with a goal uric acid level of less than 4 -No further workup at this time       No orders of the defined types were placed in this encounter.   No follow-ups on file.  Guerino Caporale, MD

## 2023-07-10 NOTE — Assessment & Plan Note (Signed)
-   Patient in acute gout flare approximately 2 weeks ago -She was seen in our clinic and prescribed colchicine and took 3 pills on the first day and 3 pills on the second with improvement of the swelling and tenderness -She has not required further colchicine -She has been avoiding red meat and does not drink alcohol -She does complain of some persistent soreness in that left toe -On exam, she has no erythema or swelling or increased local warmth but does have some mild tenderness over the medial aspect of her first MTP joint at the site of her callus -No indication for gout treatment at this time -Patient did have a normal uric acid level at the time of her prior gout flare but during acute gout flare uric acid could be elevated or normal -No indication for gout prophylaxis given that she has had only 1 flare.  However, if she does have a recurrent flare she would benefit from treatment with allopurinol with a goal uric acid level of less than 4 -No further workup at this time

## 2023-07-22 ENCOUNTER — Encounter: Payer: Self-pay | Admitting: Podiatry

## 2023-07-22 ENCOUNTER — Ambulatory Visit: Admitting: Podiatry

## 2023-07-22 VITALS — Ht 62.0 in | Wt 137.0 lb

## 2023-07-22 DIAGNOSIS — B351 Tinea unguium: Secondary | ICD-10-CM

## 2023-07-22 DIAGNOSIS — L84 Corns and callosities: Secondary | ICD-10-CM

## 2023-07-22 MED ORDER — TERBINAFINE HCL 250 MG PO TABS: 250.0000 mg | ORAL_TABLET | Freq: Every day | ORAL | 0 refills | Status: AC

## 2023-07-22 NOTE — Patient Instructions (Signed)
 VISIT SUMMARY:  Today, we discussed your persistent foot pain and nail issues. You have been experiencing ongoing problems with your right big toe, which was previously diagnosed with a fungal infection. We also reviewed your recent gout flare and the soreness in your second toe. Additionally, we talked about the painful callus on your left foot. We have updated your treatment plan to address these issues.  YOUR PLAN:  -ONYCHOMYCOSIS: Onychomycosis is a fungal infection of the nail. Since the topical medication was ineffective, we are prescribing an oral antifungal medication called Lamisil (terbinafine) for 90 days. Please trim the affected nail and monitor for any gastrointestinal side effects such as nausea, diarrhea, and stomach cramps.  -CALLUS: A callus is a thickened and hardened part of the skin that forms due to pressure or friction. You should continue to file down the callus and monitor for any changes in pain or appearance.  -GOUT: Gout is a type of arthritis caused by the buildup of uric acid crystals in the joints. Since you have a family history of gout and recently experienced a flare, you should monitor for future flares and consider using colchicine  if needed.  INSTRUCTIONS:  Please follow the treatment plan as discussed. If you experience any side effects from the medication or if your symptoms worsen, contact our office. Continue to monitor your foot for any changes and keep up with your active lifestyle as much as possible. Schedule a follow-up appointment if needed.

## 2023-07-22 NOTE — Progress Notes (Unsigned)
 Subjective:  Patient ID: Tammy Dalton, female    DOB: [birth year],  MRN: [id number]  Chief Complaint  Patient presents with   Toe Pain    RM8: np bilateral great toe pain,possibly due to gout was here few years back with same issues just wanted someone to check them out ( nail fungus and hammertoe)    Discussed the use of AI scribe software for clinical note transcription with the patient, who gave verbal consent to proceed.  History of Present Illness Tammy Dalton "Tammy Dalton" is a 78 year old female who presents with persistent foot pain and nail issues.  She has ongoing issues with her right big toe, initially injured two to three years ago while playing ball. Previously diagnosed with a fungal infection, she was prescribed a topical medication, which she used consistently but found ineffective. The toe remains red and sore, particularly when pressed on top of the nail. Although she describes the condition as 'better' since making the appointment, it still causes significant discomfort.  She experienced a gout flare in her left toe about a month ago, for which she was prescribed colchicine . The medication effectively resolved the acute symptoms within three days, but the area remained sore for a couple of weeks. There is a family history of gout, with both her father and grandmother affected.  Her second toe is consistently sore due to pressure, and she has been informed that surgical intervention would be required to address the length of the toe. She also notes that her skin is thin, making it difficult to manage calluses and corns, which she tries to file down periodically.  She has been experiencing a lot of trouble with her feet recently, which she attributes to her active lifestyle, including walking extensively for her part-time job. She also mentions a recent dental procedure and subsequent antibiotic use, which caused stomach upset, but she did not experience gastrointestinal issues with  colchicine .  Her current medications include treatments for thyroid , cholesterol, and blood pressure. No history of liver or kidney issues.      Objective:    Physical Exam VASCULAR: DP and PT pulse palpable. Foot is warm and well-perfused. Capillary fill time is brisk. DERMATOLOGIC: Mycotic right hallux nail with slight dystrophy. Normal skin turgor, texture, and temperature. No open lesions, rashes, or ulcerations. NEUROLOGIC: Normal sensation to light touch and pressure. No paresthesias on examination. ORTHOPEDIC: Painful keratosis at left medial first MTP joint. Chronic soreness at second toe. Smooth pain-free range of motion of all examined joints. No ecchymosis or bruising. No gross deformity. No pain to palpation.       Results Procedure: Nail Trimming Description: Nail trimmed back to reduce discomfort. Observed onychomycosis with thickening and white substance under the nail.   Assessment:  No diagnosis found.   Plan:  Patient was evaluated and treated and all questions answered.  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 Previous topical antifungal treatment was ineffective. Oral antifungal therapy, with an 80% cure rate, is considered more effective. No liver or kidney issues reported, supporting the use of oral antifungal therapy. - Prescribe Lamisil (terbinafine) for 90 days - Trim the affected nail - Monitor for gastrointestinal side effects such as nausea, diarrhea, and stomach cramps  Callus Painful callus on the left medial first MTP joint, possibly related to previous gout flare and high-pressure point area. - File down the callus - Monitor for changes in pain  or appearance  Gout Gout flare in the left foot approximately one month ago, treated with colchicine . Soreness persisted post-treatment. Family history suggests a hereditary component. - Monitor  for future flares - Consider colchicine  for future flares as needed      Return in about 4 months (around 11/21/2023) for follow up after nail fungus treatment.

## 2023-08-05 ENCOUNTER — Telehealth: Payer: Self-pay

## 2023-08-05 DIAGNOSIS — E039 Hypothyroidism, unspecified: Secondary | ICD-10-CM

## 2023-08-05 MED ORDER — LEVOTHYROXINE SODIUM 100 MCG PO TABS: 100.0000 ug | ORAL_TABLET | Freq: Every day | ORAL | 0 refills | Status: DC

## 2023-08-05 NOTE — Addendum Note (Signed)
 Addended by: Jackline Maser on: 08/05/2023 11:24 AM   Modules accepted: Orders

## 2023-08-05 NOTE — Telephone Encounter (Signed)
 E faxed received for refill of pts levothyroxine   You are listed as PCP do not see a TOC just see where you seen pt for an acute visit pt is scheduled with you on 10-16-23

## 2023-08-14 ENCOUNTER — Ambulatory Visit: Payer: Self-pay | Admitting: *Deleted

## 2023-08-14 NOTE — Telephone Encounter (Signed)
  Chief Complaint: cough- 2 weeks Symptoms: sinus drainage causing cough- yellow/green mucus Frequency: 2 weeks Pertinent Negatives: Patient denies fever,SOB Disposition: [] ED /[] Urgent Care (no appt availability in office) / [x] Appointment(In office/virtual)/ []  [facility]/ [] Home Care/ [] Refused Recommended Disposition /[] [facility]/ []  Follow-up with PCP Additional Notes: Offered first available appointment- patient declines- requests Friday appointment    Copied from CRM [id number]. Topic: Clinical - Red Word Triage >> Aug 14, 2023 10:30 AM Alethia Huxley E wrote: [facility] that prompted transfer to Nurse Triage: Worsening cough. Patient stated she has had a cough for the past few weeks that is producing a yellow/green mucous. Reason for Disposition  [1] Continuous (nonstop) coughing interferes with work or school AND [2] no improvement using cough treatment per Care Advice  Answer Assessment - Initial Assessment Questions 1. ONSET: "When did the cough begin?"      2 weeks 2. SEVERITY: "How bad is the cough today?"      Spells form sinus drainage 3. SPUTUM: "Describe the color of your sputum" (none, dry cough; clear, white, yellow, green)     Yellow/green- light 4. HEMOPTYSIS: "Are you coughing up any blood?" If so ask: "How much?" (flecks, streaks, tablespoons, etc.)     no 5. DIFFICULTY BREATHING: "Are you having difficulty breathing?" If Yes, ask: "How bad is it?" (e.g., mild, moderate, severe)    - MILD: No SOB at rest, mild SOB with walking, speaks normally in sentences, can lie down, no retractions, pulse < 100.    - MODERATE: SOB at rest, SOB with minimal exertion and prefers to sit, cannot lie down flat, speaks in phrases, mild retractions, audible wheezing, pulse 100-120.    - SEVERE: Very SOB at rest, speaks in single words, struggling to breathe, sitting hunched forward, retractions, pulse > 120      no 6. FEVER: "Do you have a fever?" If Yes, ask: "What is  your temperature, how was it measured, and when did it start?"     no 7. CARDIAC HISTORY: "Do you have any history of heart disease?" (e.g., heart attack, congestive heart failure)      hypertension 8. LUNG HISTORY: "Do you have any history of lung disease?"  (e.g., pulmonary embolus, asthma, emphysema)     none 9. PE RISK FACTORS: "Do you have a history of blood clots?" (or: recent major surgery, recent prolonged travel, bedridden)     no 10. OTHER SYMPTOMS: "Do you have any other symptoms?" (e.g., runny nose, wheezing, chest pain)       no  Protocols used: Cough - Acute Productive-A-AH

## 2023-08-16 ENCOUNTER — Ambulatory Visit: Admitting: Family Medicine

## 2023-08-16 ENCOUNTER — Encounter: Payer: Self-pay | Admitting: Family Medicine

## 2023-08-16 VITALS — BP 132/60 | HR 70 | Temp 97.9°F | Resp 20 | Ht 62.0 in | Wt 141.2 lb

## 2023-08-16 DIAGNOSIS — J310 Chronic rhinitis: Secondary | ICD-10-CM | POA: Diagnosis not present

## 2023-08-16 DIAGNOSIS — R0982 Postnasal drip: Secondary | ICD-10-CM

## 2023-08-16 MED ORDER — FLUTICASONE PROPIONATE 50 MCG/ACT NA SUSP: 2.0000 | Freq: Every day | NASAL | 1 refills | Status: DC

## 2023-08-16 MED ORDER — LEVOCETIRIZINE DIHYDROCHLORIDE 5 MG PO TABS: 5.0000 mg | ORAL_TABLET | Freq: Every evening | ORAL | 0 refills | Status: DC

## 2023-08-16 MED ORDER — AZELASTINE HCL 0.1 % NA SOLN: 2.0000 | Freq: Two times a day (BID) | NASAL | 1 refills | Status: DC

## 2023-08-16 NOTE — Progress Notes (Unsigned)
 SUBJECTIVE:   Chief Complaint  Patient presents with   Cough     2 weeks    Nasal Congestion   HPI Presents for acute visit  Discussed the use of AI scribe software for clinical note transcription with the patient, who gave verbal consent to proceed.  History of Present Illness Tammy STEELE "[name]" is a 78 year old female who presents with a persistent cough and nasal drainage.  Approximately two to three weeks ago, she experienced a slight sore throat, which has since resolved. Currently, she has frequent nasal drainage and a persistent cough, sometimes productive of light yellow sputum. The cough is more pronounced in the morning and requires frequent throat clearing.  She started taking Lamisil  for a toenail problem about two weeks ago, coinciding with the onset of her symptoms. She is also on lisinopril  for hypertension and has been taking Ambien  for years to aid sleep, though it still takes her two hours to fall asleep.  No fever, shortness of breath, or pain with breathing. She has not been around anyone sick and has not experienced any recent sore throat. She reports a recent sour taste in her mouth over the past couple of days, which she associates with a possible change in her throat's taste.  She has not changed her diet recently and denies any heartburn, though she is unfamiliar with the sensation. She has not been using any sprays or medications for her symptoms, aside from an inhaler prescribed previously for shortness of breath or wheezing, which she has not used recently.  She works Monday through Wednesday and notes environmental changes such as increased dust and pollen, which she speculates might contribute to her symptoms. She also mentions the unusual weather patterns, including cold and rainy conditions, which are atypical for her experience in [place] .     PERTINENT PMH / PSH: As above  OBJECTIVE:  BP 132/60   Pulse 70   Temp 97.9 F (36.6 C)    Resp 20   Ht 5\' 2"  (1.575 m)   Wt 141 lb 4 oz (64.1 kg)   SpO2 99%   BMI 25.83 kg/m    Physical Exam Vitals reviewed.  Constitutional:      General: She is not in acute distress.    Appearance: Normal appearance. She is normal weight. She is not ill-appearing, toxic-appearing or diaphoretic.  HENT:     Right Ear: Tympanic membrane, ear canal and external ear normal.     Left Ear: Tympanic membrane, ear canal and external ear normal.     Nose: Mucosal edema and rhinorrhea present.     Right Turbinates: Swollen and pale.     Left Turbinates: Swollen and pale.     Right Sinus: No maxillary sinus tenderness or frontal sinus tenderness.     Left Sinus: No maxillary sinus tenderness or frontal sinus tenderness.  Eyes:     General:        Right eye: No discharge.        Left eye: No discharge.     Conjunctiva/sclera: Conjunctivae normal.  Cardiovascular:     Rate and Rhythm: Normal rate and regular rhythm.     Heart sounds: Normal heart sounds.  Pulmonary:     Effort: Pulmonary effort is normal.     Breath sounds: Normal breath sounds.  Abdominal:     General: Bowel sounds are normal.  Musculoskeletal:        General: Normal range of motion.  Skin:  General: Skin is warm and dry.  Neurological:     General: No focal deficit present.     Mental Status: She is alert and oriented to person, place, and time. Mental status is at baseline.  Psychiatric:        Mood and Affect: Mood normal.        Behavior: Behavior normal.        Thought Content: Thought content normal.        Judgment: Judgment normal.           08/16/2023    9:30 AM 07/10/2023    3:13 PM 06/20/2023    9:37 AM 05/03/2023   11:37 AM 12/19/2022    8:37 AM  Depression screen PHQ 2/9  Decreased Interest 0 0 0 0 0  Down, Depressed, Hopeless 0 0 0 0 0  PHQ - 2 Score 0 0 0 0 0  Altered sleeping 0 0 3 3 0  Tired, decreased energy 0 0 0 0 0  Change in appetite 0 0 0 0 0  Feeling bad or failure about yourself  0  0 0 0 0  Trouble concentrating 0 0 0 0 0  Moving slowly or fidgety/restless 0 0 0 0 0  Suicidal thoughts 0 0 0 0 0  PHQ-9 Score 0 0 3 3 0  Difficult doing work/chores Not difficult at all Not difficult at all Not difficult at all Not difficult at all Not difficult at all      08/16/2023    9:31 AM 07/10/2023    3:14 PM 06/20/2023    9:37 AM 12/19/2022    8:37 AM  GAD 7 : Generalized Anxiety Score  Nervous, Anxious, on Edge 0 0 0 0  Control/stop worrying 0 0 2 0  Worry too much - different things 0 0 2 0  Trouble relaxing 0 0 2 0  Restless 0 0 0 0  Easily annoyed or irritable 0 0 2 0  Afraid - awful might happen 0 0 0 0  Total GAD 7 Score 0 0 8 0  Anxiety Difficulty Not difficult at all Not difficult at all Somewhat difficult Not difficult at all    ASSESSMENT/PLAN:  Postnasal drip Assessment & Plan: Postnasal drip likely due to allergic rhinitis or viral inflammation. Differential includes medication side effects from Lamisil , lisinopril , and Ambien , though less likely. Environmental factors such as pollen, dust, and weather changes may contribute. - Prescribe Flonase  nasal spray, two sprays twice a day. - Prescribe Astelin nasal spray, two sprays twice a day. - Prescribe Xyzal 5 mg at night - Increase water intake. - Use humidifier or steam inhalation for nasal congestion. - Consider neti pot or saline nasal irrigation. - Monitor symptoms and report worsening or fever.  Orders: -     Fluticasone  Propionate; Place 2 sprays into both nostrils daily.  Dispense: 16 g; Refill: 1 -     Azelastine HCl; Place 2 sprays into both nostrils 2 (two) times daily. Use in each nostril as directed  Dispense: 30 mL; Refill: 1 -     Levocetirizine Dihydrochloride; Take 1 tablet (5 mg total) by mouth every evening.  Dispense: 30 tablet; Refill: 0  Rhinitis, unspecified type -     Fluticasone  Propionate; Place 2 sprays into both nostrils daily.  Dispense: 16 g; Refill: 1 -     Azelastine HCl;  Place 2 sprays into both nostrils 2 (two) times daily. Use in each nostril as directed  Dispense: 30 mL;  Refill: 1 -     Levocetirizine Dihydrochloride; Take 1 tablet (5 mg total) by mouth every evening.  Dispense: 30 tablet; Refill: 0    PDMP reviewed  Return if symptoms worsen or fail to improve, for PCP.  Valli Gaw, MD

## 2023-08-16 NOTE — Patient Instructions (Signed)
 It was a pleasure meeting you today. Thank you for allowing me to take part in your health care.  Our goals for today as we discussed include:  Start Flonase  2 sprays two times a day Start Astelin 2 sprays two times a day Start Xyzal 5 mg at night  Increase water intake  If no improvement in 2-3 weeks follow up with PCP   This is a list of the screening recommended for you and due dates:  Health Maintenance  Topic Date Due   COVID-19 Vaccine (8 - Pfizer risk 2024-25 season) 05/30/2023   Flu Shot  10/25/2023   Mammogram  02/06/2024   Medicare Annual Wellness Visit  05/02/2024   DTaP/Tdap/Td vaccine (5 - Td or Tdap) 11/08/2032   Pneumonia Vaccine  Completed   DEXA scan (bone density measurement)  Completed   Hepatitis C Screening  Completed   Zoster (Shingles) Vaccine  Completed   HPV Vaccine  Aged Out   Meningitis B Vaccine  Aged Out   Colon Cancer Screening  Discontinued      If you have any questions or concerns, please do not hesitate to call the office at [phone].  I look forward to our next visit and until then take care and stay safe.  Regards,   Valli Gaw, MD   [facility]

## 2023-08-16 NOTE — Assessment & Plan Note (Signed)
 Postnasal drip likely due to allergic rhinitis or viral inflammation. Differential includes medication side effects from Lamisil , lisinopril , and Ambien , though less likely. Environmental factors such as pollen, dust, and weather changes may contribute. - Prescribe Flonase  nasal spray, two sprays twice a day. - Prescribe Astelin nasal spray, two sprays twice a day. - Prescribe Xyzal 5 mg at night - Increase water intake. - Use humidifier or steam inhalation for nasal congestion. - Consider neti pot or saline nasal irrigation. - Monitor symptoms and report worsening or fever.

## 2023-08-20 ENCOUNTER — Telehealth: Payer: Self-pay | Admitting: *Deleted

## 2023-08-20 ENCOUNTER — Ambulatory Visit: Payer: Self-pay

## 2023-08-20 NOTE — Telephone Encounter (Signed)
 Pt was seen Friday 5/23 and prescribed levocetirizine. Pt also takes 5mg  of Ambien  nightly. Pt states Sunday night after taking those medications together she had vivid, constant nightmares. Pt has not taken those medications together since and nightmares have resolved. However, pt states it takes her 2 hours to fall asleep after taking 5mg  of Ambien . Pt would like to know if her dosage of Ambien  can be increased to 10mg . RN advised pt RN would relay symptoms and question to the provider. RN advised pt if she develops any new or worsening symptoms to call us  back. Pt verbalized understanding.    Copied from CRM [id number]. Topic: Clinical - Medical Advice >> Aug 20, 2023  8:58 AM [place]ique E wrote: Reason for CRM: Patient stated that she was prescribed levocetirizine (XYZAL ) 5 MG tablet last week at a visit and is questioning if she should still be taking this medication while on zolpidem  (AMBIEN ) 5 MG tablet. Patient stated she was having nightmares on Sunday night and could not sleep. Callback number for patient is [phone]. Answer Assessment - Initial Assessment Questions 1. REASON FOR CALL: "What is your main concern right now?"     Nightmares when she took Ambien  and levo cetirizine together  2. ONSET: "When did the dreams start?"     Sunday 3. SEVERITY: "How bad is the dreams?"     Slept better last night  4. FEVER: "Do you have a fever?"     No  5. OTHER SYMPTOMS: "Do you have any other new symptoms?"     Pt was seen 5/23 for allergies/cough/post-nasal drip -- pt states she is beginning to feel better  6. TREATMENTS AND RESPONSE: "What have you done so far to try to make this better? What medicines have you used?"      Pt states she wants to take 10mg  of Ambien  every night, pt states she has trouble falling asleep with 5mg  Ambien . Pt states Sundays are her worst nights for sleep. Pt was prescribed levocetirizine 5/23. Pt states it takes her 2 hrs to fall asleep with 5mg   ambien .  Protocols used: No Guideline Available-A-AH

## 2023-08-20 NOTE — Telephone Encounter (Signed)
 Pt saw Dr Sueanne Emerald on 5/23. See Access Note below from Monday:

## 2023-08-21 ENCOUNTER — Encounter: Payer: Self-pay | Admitting: Family Medicine

## 2023-08-23 ENCOUNTER — Ambulatory Visit: Admitting: Nurse Practitioner

## 2023-08-23 VITALS — BP 138/70 | HR 83 | Temp 98.4°F | Ht 62.0 in | Wt 140.8 lb

## 2023-08-23 DIAGNOSIS — R0982 Postnasal drip: Secondary | ICD-10-CM | POA: Diagnosis not present

## 2023-08-23 DIAGNOSIS — G479 Sleep disorder, unspecified: Secondary | ICD-10-CM | POA: Diagnosis not present

## 2023-08-23 DIAGNOSIS — B351 Tinea unguium: Secondary | ICD-10-CM | POA: Diagnosis not present

## 2023-08-23 MED ORDER — ZOLPIDEM TARTRATE 10 MG PO TABS: 10.0000 mg | ORAL_TABLET | Freq: Every evening | ORAL | 0 refills | Status: DC | PRN

## 2023-08-23 NOTE — Progress Notes (Signed)
 Tammy Burkitt, Tammy Dalton Phone: [phone]  Tammy Dalton is a 78 y.o. female who presents today for medication management.   Discussed the use of AI scribe software for clinical note transcription with the patient, who gave verbal consent to proceed.  History of Present Illness   Tammy LAUX "[name]" is a 78 year old female who presents with sleep disturbances and concerns about medication side effects.  She experiences difficulty initiating and maintaining sleep, taking approximately two hours to fall asleep despite using Ambien , with two 5 mg doses on Sunday nights. Her sleep is further disrupted by waking every two to three hours to use the bathroom. No sleepwalking or unusual behaviors associated with Ambien  use are reported.  After taking levocetirizine for four to five days, she experienced nightmares and vivid dreams, leading to discontinuation of the medication. She recalls having five or six dreams in one night and remembering them clearly the next morning. She has previously used Benadryl without similar side effects.  She is currently taking terbinafine  for a toe condition, which she is supposed to continue for three months. She does not consume alcohol regularly.  She mentions having a 61 year old mother who does not live with her but has had some health problems, adding to her stress. She denies any recent changes in stress levels but acknowledges ongoing stress.      Social History   Tobacco Use  Smoking Status Former   Types: Cigarettes  Smokeless Tobacco Never    Current Outpatient Medications on File Prior to Visit  Medication Sig Dispense Refill   albuterol  (VENTOLIN  HFA) 108 (90 Base) MCG/ACT inhaler Inhale 2 puffs into the lungs every 6 (six) hours as needed for wheezing or shortness of breath. 8 g 0   aspirin EC 81 MG tablet Take 81 mg by mouth daily. Taking every other day.     azelastine  (ASTELIN ) 0.1 % nasal spray Place 2 sprays into both nostrils 2 (two) times  daily. Use in each nostril as directed 30 mL 1   cholecalciferol (VITAMIN D ) 1000 units tablet Take 1,000 Units by mouth daily.     colchicine  0.6 MG tablet Take 1 tablet (0.6 mg total) by mouth daily. 60 tablet 0   ferrous sulfate 325 (65 FE) MG tablet Take 325 mg by mouth daily with breakfast.     fluticasone  (FLONASE ) 50 MCG/ACT nasal spray Place 2 sprays into both nostrils daily. 16 g 1   levocetirizine (XYZAL ) 5 MG tablet Take 1 tablet (5 mg total) by mouth every evening. 30 tablet 0   levothyroxine  (SYNTHROID ) 100 MCG tablet Take 1 tablet (100 mcg total) by mouth daily before breakfast. 90 tablet 0   lisinopril -hydrochlorothiazide  (ZESTORETIC ) 20-12.5 MG tablet Take 2 tablets by mouth daily. (Patient taking differently: Take 1.5 tablets by mouth daily. Patient takes 1 and a half tablets daily.) 180 tablet 3   rosuvastatin  (CRESTOR ) 40 MG tablet Take 1 tablet by mouth once daily 90 tablet 1   terbinafine  (LAMISIL ) 250 MG tablet Take 1 tablet (250 mg total) by mouth daily. 90 tablet 0   Turmeric (QC TUMERIC COMPLEX) 500 MG CAPS Take 1 tablet by mouth daily.     No current facility-administered medications on file prior to visit.     ROS see history of present illness  Objective  Physical Exam Vitals:   08/23/23 1043  BP: 138/70  Pulse: 83  Temp: 98.4 F (36.9 C)  SpO2: 96%    BP Readings from Last 3 Encounters:  08/23/23 138/70  08/16/23 132/60  07/10/23 122/84   Wt Readings from Last 3 Encounters:  08/23/23 140 lb 12.8 oz (63.9 kg)  08/16/23 141 lb 4 oz (64.1 kg)  07/22/23 137 lb (62.1 kg)    Physical Exam Constitutional:      General: She is not in acute distress.    Appearance: Normal appearance.  HENT:     Head: Normocephalic.  Cardiovascular:     Rate and Rhythm: Normal rate and regular rhythm.     Heart sounds: Normal heart sounds.  Pulmonary:     Effort: Pulmonary effort is normal.     Breath sounds: Normal breath sounds.  Skin:    General: Skin is  warm and dry.  Neurological:     General: No focal deficit present.     Mental Status: She is alert.  Psychiatric:        Mood and Affect: Mood normal.        Behavior: Behavior normal.      Assessment/Plan: Please see individual problem list.  Sleeping difficulty Assessment & Plan: Chronic insomnia persists with difficulty initiating and maintaining sleep. Current 5 mg Ambien  is insufficient. Discussed risks of increased dose, including behavior changes. She prefers an increased dose of immediate-release Ambien . Increase Ambien  to 10 mg for a two-week trial. Call in two weeks to assess response. Send prescription for 10 mg Ambien  to pharmacy. Allow use of remaining 5 mg Ambien  tablets until depleted. PDMP reviewed.   Orders: -     Zolpidem  Tartrate; Take 1 tablet (10 mg total) by mouth at bedtime as needed for sleep.  Dispense: 15 tablet; Refill: 0  Postnasal drip Assessment & Plan: Allergic rhinitis presents with nasal congestion and postnasal drip. Symptoms improved with nasal sprays. Discontinued levocetirizine due to nightmares. Benadryl was used previously without adverse effects. Resume nasal sprays. Consider Benadryl at bedtime.   Onychomycosis Assessment & Plan: Chronic onychomycosis of the toe persists. Previously treated with topical oil without resolution. Currently on oral terbinafine  for three months. No liver enzyme abnormalities or significant alcohol use. Continue terbinafine  for three months. Check liver enzymes during next lab work. Follow up with Podiatry as scheduled.       Return in about 3 months (around 11/28/2023) for [facility] and Annual Exam.   Tammy Burkitt, Tammy Dalton [facility]

## 2023-08-27 ENCOUNTER — Other Ambulatory Visit: Payer: Self-pay

## 2023-08-27 DIAGNOSIS — G479 Sleep disorder, unspecified: Secondary | ICD-10-CM

## 2023-08-27 NOTE — Telephone Encounter (Signed)
 Copied from CRM [id number]. Topic: Clinical - Medical Advice >> Aug 27, 2023  8:21 AM Allyne Areola wrote: Reason for CRM: Patient called to speak with Bluford Burkitt regarding a medication change Kacy recommended to patient. She would like to speak with her directly if possible.  The medication is zolpidem  (AMBIEN ) 10 MG tablet .

## 2023-08-28 ENCOUNTER — Encounter: Payer: Self-pay | Admitting: Nurse Practitioner

## 2023-08-28 DIAGNOSIS — B351 Tinea unguium: Secondary | ICD-10-CM | POA: Insufficient documentation

## 2023-08-28 NOTE — Assessment & Plan Note (Signed)
 Chronic onychomycosis of the toe persists. Previously treated with topical oil without resolution. Currently on oral terbinafine  for three months. No liver enzyme abnormalities or significant alcohol use. Continue terbinafine  for three months. Check liver enzymes during next lab work. Follow up with Podiatry as scheduled.

## 2023-08-28 NOTE — Assessment & Plan Note (Signed)
 Chronic insomnia persists with difficulty initiating and maintaining sleep. Current 5 mg Ambien  is insufficient. Discussed risks of increased dose, including behavior changes. She prefers an increased dose of immediate-release Ambien . Increase Ambien  to 10 mg for a two-week trial. Call in two weeks to assess response. Send prescription for 10 mg Ambien  to pharmacy. Allow use of remaining 5 mg Ambien  tablets until depleted. PDMP reviewed.

## 2023-08-28 NOTE — Assessment & Plan Note (Signed)
 Allergic rhinitis presents with nasal congestion and postnasal drip. Symptoms improved with nasal sprays. Discontinued levocetirizine due to nightmares. Benadryl was used previously without adverse effects. Resume nasal sprays. Consider Benadryl at bedtime.

## 2023-09-09 ENCOUNTER — Other Ambulatory Visit: Payer: Self-pay

## 2023-09-09 ENCOUNTER — Encounter: Payer: Medicare Other | Admitting: Family Medicine

## 2023-09-09 DIAGNOSIS — I10 Essential (primary) hypertension: Secondary | ICD-10-CM

## 2023-09-09 MED ORDER — ZOLPIDEM TARTRATE 10 MG PO TABS: 10.0000 mg | ORAL_TABLET | Freq: Every evening | ORAL | 0 refills | Status: DC | PRN

## 2023-09-09 MED ORDER — LISINOPRIL-HYDROCHLOROTHIAZIDE 20-12.5 MG PO TABS: 2.0000 | ORAL_TABLET | Freq: Every day | ORAL | 3 refills | Status: AC

## 2023-09-09 NOTE — Addendum Note (Signed)
 Addended by: Bluford Burkitt on: 09/09/2023 03:45 PM   Modules accepted: Orders

## 2023-09-09 NOTE — Telephone Encounter (Signed)
 Copied from CRM [id number]. Topic: Clinical - Medication Refill >> Sep 09, 2023  8:46 AM Emmet Harm C wrote: Patient also wanted to confirm she likes te 10mg  and would like a 90 day supply  Medication: zolpidem  (AMBIEN ) 10 MG tablet     Has the patient contacted their pharmacy? No (Agent: If no, request that the patient contact the pharmacy for the refill. If patient does not wish to contact the pharmacy document the reason why and proceed with request.) (Agent: If yes, when and what did the pharmacy advise?)  This is the patient's preferred pharmacy:  [facility] [address], [place] - [address] [place] [address] Phone: [phone] Fax: [phone]  Is this the correct pharmacy for this prescription? Yes If no, delete pharmacy and type the correct one.   Has the prescription been filled recently? Yes  Is the patient out of the medication? No  Has the patient been seen for an appointment in the last year OR does the patient have an upcoming appointment? Yes  Can we respond through MyChart? No  Agent: Please be advised that Rx refills may take up to 3 business days. We ask that you follow-up with your pharmacy.

## 2023-09-09 NOTE — Addendum Note (Signed)
 Addended by: Barbette Boom A on: 09/09/2023 10:26 AM   Modules accepted: Orders

## 2023-09-30 ENCOUNTER — Ambulatory Visit: Admitting: Nurse Practitioner

## 2023-09-30 ENCOUNTER — Ambulatory Visit: Payer: Self-pay | Admitting: *Deleted

## 2023-09-30 VITALS — BP 120/68 | HR 94 | Temp 98.7°F | Ht 62.0 in | Wt 138.2 lb

## 2023-09-30 DIAGNOSIS — R051 Acute cough: Secondary | ICD-10-CM

## 2023-09-30 DIAGNOSIS — J069 Acute upper respiratory infection, unspecified: Secondary | ICD-10-CM | POA: Insufficient documentation

## 2023-09-30 DIAGNOSIS — J029 Acute pharyngitis, unspecified: Secondary | ICD-10-CM | POA: Diagnosis not present

## 2023-09-30 LAB — POCT RAPID STREP A (OFFICE): Rapid Strep A Screen: NEGATIVE

## 2023-09-30 LAB — POC COVID19 BINAXNOW: SARS Coronavirus 2 Ag: NEGATIVE

## 2023-09-30 MED ORDER — AZITHROMYCIN 250 MG PO TABS: ORAL_TABLET | ORAL | 0 refills | Status: AC

## 2023-09-30 MED ORDER — PREDNISONE 20 MG PO TABS
40.0000 mg | ORAL_TABLET | Freq: Every day | ORAL | 0 refills | Status: DC
Start: 2023-09-30 — End: 2023-10-18

## 2023-09-30 NOTE — Telephone Encounter (Signed)
                         FYI Only or Action Required?: Action required by provider: request for appointment.  Patient was last seen in primary care on 09/30/2023 by Gretel App, NP. Called Nurse Triage reporting Cough. Symptoms began a week ago. Interventions attempted: OTC medications: cough meds. Symptoms are: gradually worsening.  Triage Disposition: See Physician Within 24 Hours  Patient/caregiver understands and will follow disposition?: Yes                Copied from CRM [id number]. Topic: Clinical - Red Word Triage >> Sep 30, 2023  8:02 AM Suzen RAMAN wrote: Red Word that prompted transfer to Nurse Triage: Productive cough,discolored sputum,sore throat and severe fatigue Reason for Disposition  [1] Continuous (nonstop) coughing interferes with work or school AND [2] no improvement using cough treatment per Care Advice  Answer Assessment - Initial Assessment Questions 1. ONSET: When did the cough begin?      1 week ago  2. SEVERITY: How bad is the cough today?      Worsening  3. SPUTUM: Describe the color of your sputum (none, dry cough; clear, white, yellow, green)     green 4. HEMOPTYSIS: Are you coughing up any blood? If so ask: How much? (flecks, streaks, tablespoons, etc.)     Na  5. DIFFICULTY BREATHING: Are you having difficulty breathing? If Yes, ask: How bad is it? (e.g., mild, moderate, severe)    - MILD: No SOB at rest, mild SOB with walking, speaks normally in sentences, can lie down, no retractions, pulse < 100.    - MODERATE: SOB at rest, SOB with minimal exertion and prefers to sit, cannot lie down flat, speaks in phrases, mild retractions, audible wheezing, pulse 100-120.    - SEVERE: Very SOB at rest, speaks in single words, struggling to breathe, sitting hunched forward, retractions, pulse > 120      na 6. FEVER: Do you have a fever? If Yes, ask: What is your temperature, how was it measured, and when did it start?      na 7. CARDIAC HISTORY: Do you have any history of heart disease? (e.g., heart attack, congestive heart failure)      na 8. LUNG HISTORY: Do you have any history of lung disease?  (e.g., pulmonary embolus, asthma, emphysema)     na 9. PE RISK FACTORS: Do you have a history of blood clots? (or: recent major surgery, recent prolonged travel, bedridden)     na 10. OTHER SYMPTOMS: Do you have any other symptoms? (e.g., runny nose, wheezing, chest pain)       Cough green sputum hears wheezing , sore throat, fatigue  11. PREGNANCY: Is there any chance you are pregnant? When was your last menstrual period?       na 12. TRAVEL: Have you traveled out of the country in the last month? (e.g., travel history, exposures)       Na   Appt scheduled today  Protocols used: Cough - Acute Productive-A-AH

## 2023-09-30 NOTE — Telephone Encounter (Signed)
 Pt seen in office by Bethanie Dicker

## 2023-09-30 NOTE — Progress Notes (Unsigned)
 Leron Glance, NP-C Phone: [phone]  Tammy Dalton is a 78 y.o. female who presents today for cough.   Discussed the use of AI scribe software for clinical note transcription with the patient, who gave verbal consent to proceed.  History of Present Illness   Tammy Dalton is a 78 year old female who presents with a worsening cough and fatigue.  She has experienced a dry cough for about a week, which worsened over the weekend. On Saturday, she developed a sore throat and significant fatigue, leading her to stay in bed all day Sunday. This morning, her cough became productive with dark green sputum. No shortness of breath, fever, nausea, vomiting, or significant body aches, though she has a slight headache, possibly from coughing. Deep breaths trigger her cough. She does not feel chest congestion but mentions facial congestion and drainage.  She has been taking over-the-counter medications including DayQuil, Mucinex, Robitussin DM, and acetaminophen  without significant relief. She takes Mucinex every twelve hours and acetaminophen  before bed, along with a non-expired cough medicine. She has not been using Flonase  or other nasal sprays recently.  Her roommate has a bronchial infection, but she is unaware of any recent exposure to COVID-19 or flu. She is allergic to codeine and has not taken prednisone  before. She has not eaten much in the past few days.      Social History   Tobacco Use  Smoking Status Former   Types: Cigarettes  Smokeless Tobacco Never    Current Outpatient Medications on File Prior to Visit  Medication Sig Dispense Refill   albuterol  (VENTOLIN  HFA) 108 (90 Base) MCG/ACT inhaler Inhale 2 puffs into the lungs every 6 (six) hours as needed for wheezing or shortness of breath. 8 g 0   aspirin EC 81 MG tablet Take 81 mg by mouth daily. Taking every other day.     cholecalciferol (VITAMIN D ) 1000 units tablet Take 1,000 Units by mouth daily.     ferrous sulfate 325  (65 FE) MG tablet Take 325 mg by mouth daily with breakfast.     levothyroxine  (SYNTHROID ) 100 MCG tablet Take 1 tablet (100 mcg total) by mouth daily before breakfast. 90 tablet 0   lisinopril -hydrochlorothiazide  (ZESTORETIC ) 20-12.5 MG tablet Take 2 tablets by mouth daily. 180 tablet 3   rosuvastatin  (CRESTOR ) 40 MG tablet Take 1 tablet by mouth once daily 90 tablet 1   terbinafine  (LAMISIL ) 250 MG tablet Take 1 tablet (250 mg total) by mouth daily. 90 tablet 0   Turmeric (QC TUMERIC COMPLEX) 500 MG CAPS Take 1 tablet by mouth daily.     zolpidem  (AMBIEN ) 10 MG tablet Take 1 tablet (10 mg total) by mouth at bedtime as needed for sleep. 90 tablet 0   No current facility-administered medications on file prior to visit.     ROS see history of present illness  Objective  Physical Exam Vitals:   09/30/23 0851  BP: 120/68  Pulse: 94  Temp: 98.7 F (37.1 C)  SpO2: 95%    BP Readings from Last 3 Encounters:  09/30/23 120/68  08/23/23 138/70  08/16/23 132/60   Wt Readings from Last 3 Encounters:  09/30/23 138 lb 3.2 oz (62.7 kg)  08/23/23 140 lb 12.8 oz (63.9 kg)  08/16/23 141 lb 4 oz (64.1 kg)    Physical Exam Constitutional:      General: She is not in acute distress.    Appearance: Normal appearance.  HENT:     Head: Normocephalic.  Right Ear: Tympanic membrane normal.     Left Ear: Tympanic membrane normal.     Nose: Congestion present.     Mouth/Throat:     Mouth: Mucous membranes are moist.     Pharynx: Oropharynx is clear.  Eyes:     Conjunctiva/sclera: Conjunctivae normal.     Pupils: Pupils are equal, round, and reactive to light.  Cardiovascular:     Rate and Rhythm: Normal rate and regular rhythm.     Heart sounds: Normal heart sounds.  Pulmonary:     Effort: Pulmonary effort is normal.     Breath sounds: Normal breath sounds.  Lymphadenopathy:     Cervical: No cervical adenopathy.  Skin:    General: Skin is warm and dry.  Neurological:      General: No focal deficit present.     Mental Status: She is alert.  Psychiatric:        Mood and Affect: Mood normal.        Behavior: Behavior normal.      Assessment/Plan: Please see individual problem list.  Upper respiratory tract infection, unspecified type Assessment & Plan: Symptoms suggest an acute bacterial URI. COVID and Strep negative today. Start Azithromycin  and prednisone  for 5 days, once daily in the morning. Counseled on common side effects. Continue Mucinex. Encourage adequate hydration. Return precautions given to patient.   Orders: -     predniSONE ; Take 2 tablets (40 mg total) by mouth daily with breakfast.  Dispense: 10 tablet; Refill: 0 -     Azithromycin ; Take 2 tablets on day 1, then 1 tablet daily on days 2 through 5  Dispense: 6 tablet; Refill: 0  Acute cough -     POC COVID-19 BinaxNow  Sore throat -     POC COVID-19 BinaxNow -     POCT rapid strep A    Return if symptoms worsen or fail to improve.   Leron Glance, NP-C  Primary Care - [facility]

## 2023-10-03 ENCOUNTER — Encounter: Payer: Self-pay | Admitting: Nurse Practitioner

## 2023-10-03 NOTE — Assessment & Plan Note (Addendum)
 Symptoms suggest an acute bacterial URI. COVID and Strep negative today. Start Azithromycin  and prednisone  for 5 days, once daily in the morning. Counseled on common side effects. Continue Mucinex. Encourage adequate hydration. Return precautions given to patient.

## 2023-10-16 ENCOUNTER — Encounter: Admitting: Nurse Practitioner

## 2023-10-17 ENCOUNTER — Ambulatory Visit: Payer: Self-pay

## 2023-10-17 NOTE — Telephone Encounter (Signed)
 FYI Only or Action Required?: Action required by provider: clinical question for provider.  Patient was last seen in primary care on 09/30/2023 by Tammy App, Tammy Dalton.  Called Nurse Triage reporting Shortness of Breath.  Symptoms began a week ago.  Interventions attempted: Nothing.  Symptoms are: unchanged.  Triage Disposition: See HCP Within 4 Hours (Or PCP Triage)  Patient/caregiver understands and will follow disposition?: No, refuses dispositionCopied from CRM #[id number]. Topic: Clinical - Red Word Triage >> Oct 17, 2023  8:02 AM Suzen RAMAN wrote: Red Word that prompted transfer to Nurse Triage: prodctive cough, SOB and feels like there is a  knot in throat Reason for Disposition  [1] MILD difficulty breathing (e.g., minimal/no SOB at rest, SOB with walking, pulse < 100) AND [2] NEW-onset or WORSE than normal  Answer Assessment - Initial Assessment Questions 1. RESPIRATORY STATUS: Describe your breathing? (e.g., wheezing, shortness of breath, unable to speak, severe coughing)      SOB happens anytime 2. ONSET: When did this breathing problem begin?      10 days ago 3. PATTERN Does the difficult breathing come and go, or has it been constant since it started?      Comes and goes 4. SEVERITY: How bad is your breathing? (e.g., mild, moderate, severe)      mild 5. RECURRENT SYMPTOM: Have you had difficulty breathing before? If Yes, ask: When was the last time? and What happened that time?      denies 6. CARDIAC HISTORY: Do you have any history of heart disease? (e.g., heart attack, angina, bypass surgery, angioplasty)      denies 7. LUNG HISTORY: Do you have any history of lung disease?  (e.g., pulmonary embolus, asthma, emphysema)     denies 8. CAUSE: What do you think is causing the breathing problem?      Not sure 9. OTHER SYMPTOMS: Do you have any other symptoms? (e.g., chest pain, cough, dizziness, fever, runny nose)     cough 10. O2 SATURATION MONITOR:  Do  you use an oxygen saturation monitor (pulse oximeter) at home? If Yes, ask: What is your reading (oxygen level) today? What is your usual oxygen saturation reading? (e.g., 95%)       na    Pt stated SOB started 10 days ago. Pt stated it could hit anytime. When I get hot I feel it more. I'm so tired of this cough. It's not productive. Pt feels she had a knot in chest. Pt played golf yesterday and was super hot and felt SOB more.  Pt feels she has pneumonia. No office appts until 8/4. RN advised UC but pt has refused. Pt is asking if this can be handled over phone since PCP just saw me. Please advise  Protocols used: Breathing Difficulty-A-AH

## 2023-10-18 ENCOUNTER — Ambulatory Visit

## 2023-10-18 ENCOUNTER — Ambulatory Visit: Payer: Self-pay

## 2023-10-18 VITALS — BP 126/60 | HR 88 | Temp 98.5°F | Ht 62.0 in | Wt 139.6 lb

## 2023-10-18 DIAGNOSIS — R058 Other specified cough: Secondary | ICD-10-CM | POA: Diagnosis not present

## 2023-10-18 DIAGNOSIS — R062 Wheezing: Secondary | ICD-10-CM | POA: Insufficient documentation

## 2023-10-18 LAB — BRAIN NATRIURETIC PEPTIDE: Pro B Natriuretic peptide (BNP): 16 pg/mL (ref 0.0–100.0)

## 2023-10-18 LAB — CBC
HCT: 33.4 % — ABNORMAL LOW (ref 36.0–46.0)
Hemoglobin: 11 g/dL — ABNORMAL LOW (ref 12.0–15.0)
MCHC: 32.9 g/dL (ref 30.0–36.0)
MCV: 86.3 fl (ref 78.0–100.0)
Platelets: 195 K/uL (ref 150.0–400.0)
RBC: 3.87 Mil/uL (ref 3.87–5.11)
RDW: 16.7 % — ABNORMAL HIGH (ref 11.5–15.5)
WBC: 5 K/uL (ref 4.0–10.5)

## 2023-10-18 MED ORDER — BENZONATATE 100 MG PO CAPS: 100.0000 mg | ORAL_CAPSULE | Freq: Three times a day (TID) | ORAL | 0 refills | Status: DC

## 2023-10-18 MED ORDER — PREDNISONE 20 MG PO TABS: 20.0000 mg | ORAL_TABLET | Freq: Every day | ORAL | 0 refills | Status: AC

## 2023-10-18 NOTE — Progress Notes (Signed)
 Please let the patient know I reviewed her chest x-ray which does not suggest pneumonia thus no antibiotic treatment recommended at this time.   Thank you,  Luke Shade, MD

## 2023-10-18 NOTE — Assessment & Plan Note (Addendum)
-   Initially productive, transitioned to dry after treatment with Azithromycin  and Prednisone  in 09/30/23. - B Nat Peptide. If elevated wills tart her on Furosemide 20 mg with referral to cardiology for further evaluation.  - DG Chest 2 View. If chest x-ray suggestive for pneumonia will add treatment with Doxycycline  100 mg BID for 7 days, if no pneumonia no antibiotic recommended.  - CBC - Start Albuterol  inhaler every 6 hourly as needed for wheezing, already has this prescribed to her which she has not been taking.  - Start benzonatate  100 Mg capsule every 8 hourly prn for cough.  - Prednisone  20 mg, once daily with food in the morning for 5 days.

## 2023-10-18 NOTE — Progress Notes (Signed)
 Acute Office Visit  Subjective:    Patient ID: Tammy Dalton, female    DOB: 03-26-1946, 78 y.o.   MRN: [id number]  Chief Complaint  Patient presents with   Cough    For about a month   History of Present Illness Discussed the use of AI scribe software for clinical note transcription with the patient, who gave verbal consent to proceed.  Tammy Dalton is a 78 year old female who presents with a persistent cough for about a month. Cough started around beginning of , was treated by Azithromycin  and Prednisone  on September 30, 2023. Initially cough was productive in nature and above treatment helped with sputum production. Now cough has transitioned to a dry. The cough is constant in nature.  No fever, chills, diarrhea, or body aches, but she feels generally more tired than usual. She has a history of smoking long time ago no known h/o asthma or COPD. She was prescribed Albuterol  inhaler in the past for wheezing and cough which she has not been using.   She is on lisinopril -hydrochlorothiazide  for HTN.  Her roommate was recently sick with a bronchial infection. She has not had a chest x-ray previously. She has been able to play golf twice but found it difficult to complete the activity due to her symptoms.  She has been using Ambien  for sleep, recently increasing her dose from 5 mg to 10 mg due to difficulty sleeping.   Review of Systems  Constitutional:  Positive for malaise/fatigue. Negative for chills and fever.  HENT:  Negative for ear pain.   Respiratory:  Positive for cough (dry) and wheezing. Negative for shortness of breath.   Cardiovascular:  Negative for chest pain, orthopnea, leg swelling and PND.  Gastrointestinal:  Negative for abdominal pain, diarrhea and vomiting.  Musculoskeletal:  Negative for neck pain.  Psychiatric/Behavioral:  The patient has insomnia.       Objective:    BP 126/60 (BP Location: Right Arm, Patient Position: Sitting, Cuff Size: Normal)   Pulse  88   Temp 98.5 F (36.9 C) (Oral)   Ht 5' 2 (1.575 m)   Wt 139 lb 9.6 oz (63.3 kg)   SpO2 96%   BMI 25.53 kg/m    Physical Exam HENT:     Head: Normocephalic and atraumatic.     Right Ear: Tympanic membrane normal.     Left Ear: Tympanic membrane normal.     Mouth/Throat:     Mouth: Mucous membranes are moist.  Cardiovascular:     Rate and Rhythm: Normal rate.  Pulmonary:     Breath sounds: Wheezing (bibasilar wheezing) present. No rales.  Abdominal:     Palpations: Abdomen is soft.  Musculoskeletal:     Cervical back: Neck supple. No rigidity.     Right lower leg: No edema.     Left lower leg: No edema.  Skin:    General: Skin is warm.  Neurological:     Mental Status: She is oriented to person, place, and time.  Psychiatric:        Mood and Affect: Affect is tearful (talking about insomnia).     No results found for any visits on 10/18/23.     Assessment & Plan:  Dry cough Assessment & Plan: - Initially productive, transitioned to dry after treatment with Azithromycin  and Prednisone  in 09/30/23. - B Nat Peptide. If elevated wills tart her on Furosemide 20 mg with referral to cardiology for further evaluation.  - DG  Chest 2 View. If chest x-ray suggestive for pneumonia will add treatment with Doxycycline  100 mg BID for 7 days, if no pneumonia no antibiotic recommended.  - CBC - Start Albuterol  inhaler every 6 hourly as needed for wheezing, already has this prescribed to her which she has not been taking.  - Start benzonatate  100 Mg capsule every 8 hourly prn for cough.  - Prednisone  20 mg, once daily with food in the morning for 5 days.   Orders: -     Brain natriuretic peptide -     DG Chest 2 View; Future -     CBC -     Benzonatate ; Take 1 capsule (100 mg total) by mouth 3 (three) times daily.  Dispense: 21 capsule; Refill: 0 -     predniSONE ; Take 1 tablet (20 mg total) by mouth daily with breakfast for 5 days.  Dispense: 5 tablet; Refill: 0   I spent 40  minutes on the day of this face-to-face encounter reviewing the patient's medical history, recent visit with her PCP for similar concerns, discussing ongoing concerns, and reviewing the assessment and plan with the patient. This time also included time spent on post-visit ordering and reviewing diagnostics and therapeutics with the patient.  Return in about 4 weeks (around 11/15/2023) for Chronic and f/u on cough.  Luke Shade, MD

## 2023-10-18 NOTE — Patient Instructions (Addendum)
-   Please start using Albuterol  inhaler every 6 hourly as needed for cough.  - Tessalon  one tablet every 8 hourly as needed for cough for one week.  - If no pneumonia on chest x-ray I recommend we start you on Prednisone  20 mg for 5 days. If your chest x-ray shows pneumonia we will treat you with antibiotic plus the steroid. Please take Prednisone  with food in morning.

## 2023-10-21 NOTE — Progress Notes (Signed)
 FYI only.

## 2023-10-27 ENCOUNTER — Other Ambulatory Visit: Payer: Self-pay | Admitting: Nurse Practitioner

## 2023-10-27 DIAGNOSIS — E039 Hypothyroidism, unspecified: Secondary | ICD-10-CM

## 2023-10-28 ENCOUNTER — Other Ambulatory Visit: Payer: Self-pay

## 2023-10-28 DIAGNOSIS — E785 Hyperlipidemia, unspecified: Secondary | ICD-10-CM

## 2023-10-28 MED ORDER — ROSUVASTATIN CALCIUM 40 MG PO TABS: 40.0000 mg | ORAL_TABLET | Freq: Every day | ORAL | 3 refills | Status: AC

## 2023-11-20 ENCOUNTER — Ambulatory Visit: Admitting: Podiatry

## 2023-11-27 ENCOUNTER — Ambulatory Visit: Admitting: Nurse Practitioner

## 2023-12-04 ENCOUNTER — Encounter: Payer: Self-pay | Admitting: Nurse Practitioner

## 2023-12-04 ENCOUNTER — Ambulatory Visit: Admitting: Nurse Practitioner

## 2023-12-04 VITALS — BP 160/80 | HR 73 | Temp 98.5°F | Ht 62.0 in | Wt 141.2 lb

## 2023-12-04 DIAGNOSIS — M1990 Unspecified osteoarthritis, unspecified site: Secondary | ICD-10-CM

## 2023-12-04 DIAGNOSIS — G479 Sleep disorder, unspecified: Secondary | ICD-10-CM

## 2023-12-04 DIAGNOSIS — N1831 Chronic kidney disease, stage 3a: Secondary | ICD-10-CM

## 2023-12-04 DIAGNOSIS — E039 Hypothyroidism, unspecified: Secondary | ICD-10-CM

## 2023-12-04 DIAGNOSIS — I10 Essential (primary) hypertension: Secondary | ICD-10-CM

## 2023-12-04 DIAGNOSIS — E785 Hyperlipidemia, unspecified: Secondary | ICD-10-CM

## 2023-12-04 DIAGNOSIS — M8580 Other specified disorders of bone density and structure, unspecified site: Secondary | ICD-10-CM

## 2023-12-04 DIAGNOSIS — Z1231 Encounter for screening mammogram for malignant neoplasm of breast: Secondary | ICD-10-CM

## 2023-12-04 MED ORDER — ZOLPIDEM TARTRATE 10 MG PO TABS: 10.0000 mg | ORAL_TABLET | Freq: Every evening | ORAL | 1 refills | Status: AC | PRN

## 2023-12-04 NOTE — Progress Notes (Signed)
 Leron Glance, NP-C Phone: [phone]  Tammy Dalton is a 78 y.o. female who presents today for transfer of care.   Discussed the use of AI scribe software for clinical note transcription with the patient, who gave verbal consent to proceed.  History of Present Illness   Tammy Dalton is a 78 year old female who presents for medication management and follow-up.  She is due for a refill on Ambien , which she takes about 30 minutes before bed. It effectively helps her fall asleep more quickly and allows her to sleep for six to seven hours per night, compared to a previous delay of two to three hours before falling asleep.  She takes Crestor  for cholesterol management and experiences muscle aches, which she attributes to arthritis in her fingers. She receives occasional injections in her hand for arthritis pain. About a month ago, after playing golf, she experienced severe shoulder pain and was unable to move her arm. An x-ray at a walk-in clinic revealed an inflamed bursitis cyst, and she was treated with prednisone , which resolved the symptoms within two days. She continues to experience shoulder and finger pain, especially after activities like pulling weeds.  She takes acetaminophen  for pain relief due to concerns about kidney function and avoids NSAIDs. She experiences increased pain after playing golf.  She is on lisinopril  and hydrochlorothiazide  for blood pressure management, taking one and a half tablets daily due to dizziness with a higher dose. She does not monitor her blood pressure at home but reports no chest pain, shortness of breath, dizziness, or swelling. Her blood pressure is usually in the 120s during blood donation visits.  She takes levothyroxine  for thyroid  management and reports no skin changes, hair changes, heart palpitations, or temperature regulation issues.  She gives blood every eight weeks and takes tart cherry supplements to prevent gout, which she has not  experienced since starting the supplement.      Social History   Tobacco Use  Smoking Status Former   Types: Cigarettes  Smokeless Tobacco Never    Current Outpatient Medications on File Prior to Visit  Medication Sig Dispense Refill   aspirin EC 81 MG tablet Take 81 mg by mouth daily. Taking every other day.     cholecalciferol (VITAMIN D ) 1000 units tablet Take 1,000 Units by mouth daily.     ferrous sulfate 325 (65 FE) MG tablet Take 325 mg by mouth daily with breakfast.     levothyroxine  (SYNTHROID ) 100 MCG tablet TAKE 1 TABLET BY MOUTH ONCE DAILY BEFORE BREAKFAST 90 tablet 0   lisinopril -hydrochlorothiazide  (ZESTORETIC ) 20-12.5 MG tablet Take 2 tablets by mouth daily. (Patient taking differently: Take 2 tablets by mouth daily.) 180 tablet 3   rosuvastatin  (CRESTOR ) 40 MG tablet Take 1 tablet (40 mg total) by mouth daily. 90 tablet 3   TART CHERRY PO Take by mouth.     Turmeric (QC TUMERIC COMPLEX) 500 MG CAPS Take 1 tablet by mouth daily.     No current facility-administered medications on file prior to visit.     ROS see history of present illness  Objective  Physical Exam Vitals:   12/04/23 1518 12/04/23 1549  BP: (!) 144/82 (!) 160/80  Pulse: 73   Temp: 98.5 F (36.9 C)   SpO2: 97%     BP Readings from Last 3 Encounters:  12/04/23 (!) 160/80  10/18/23 126/60  09/30/23 120/68   Wt Readings from Last 3 Encounters:  12/04/23 141 lb 3.2 oz (64 kg)  10/18/23 139 lb 9.6 oz (63.3 kg)  09/30/23 138 lb 3.2 oz (62.7 kg)    Physical Exam Constitutional:      General: She is not in acute distress.    Appearance: Normal appearance.  HENT:     Head: Normocephalic.  Cardiovascular:     Rate and Rhythm: Normal rate and regular rhythm.     Heart sounds: Normal heart sounds.  Pulmonary:     Effort: Pulmonary effort is normal.     Breath sounds: Normal breath sounds.  Skin:    General: Skin is warm and dry.  Neurological:     General: No focal deficit present.      Mental Status: She is alert.  Psychiatric:        Mood and Affect: Mood normal.        Behavior: Behavior normal.      Assessment/Plan: Please see individual problem list.  Arthritis Assessment & Plan: Chronic polyarthritis significantly affects her shoulders and fingers, causing muscle aches and stiffness. Prednisone  is effective for acute bursitis. Recommend over-the-counter Aleve as needed, particularly after physical activity. Monitor kidney function regularly.    Primary hypertension Assessment & Plan: Blood pressure elevated x 2 readings today in office. Previously well controlled. Currently on lisinopril  and hydrochlorothiazide , 1.5 tablets daily with home blood pressure in the 120s. Continue lisinopril  and hydrochlorothiazide  at the current dose. Encouraged to continue home monitoring and contact if remaining consistently elevated.   Orders: -     CBC with Differential/Platelet  Hypothyroidism, unspecified type Assessment & Plan: Hypothyroidism is managed with levothyroxine . Continue.   Orders: -     TSH  Hyperlipidemia, unspecified hyperlipidemia type Assessment & Plan: Hyperlipidemia is managed with Crestor . Continue Crestor  as prescribed.  Orders: -     Lipid panel  Sleeping difficulty Assessment & Plan: Chronic sleep disorder is managed with Ambien , allowing her to achieve 6-7 hours of sleep per night without adverse effects. Continue Ambien  10 mg daily at bedtime. PDMP reviewed. Refills sent.   Orders: -     Zolpidem  Tartrate; Take 1 tablet (10 mg total) by mouth at bedtime as needed for sleep.  Dispense: 90 tablet; Refill: 1  Stage 3a chronic kidney disease (HCC) -     Comprehensive metabolic panel with GFR  Osteopenia, unspecified location -     VITAMIN D  25 Hydroxy (Vit-D Deficiency, Fractures)  Screening mammogram for breast cancer -     3D Screening Mammogram, Left and Right; Future     Return in about 6 months (around 06/02/2024) for  Follow up.   Leron Glance, NP-C [place] [facility] - [facility]

## 2023-12-05 LAB — CBC WITH DIFFERENTIAL/PLATELET
Basophils Absolute: 0.1 K/uL (ref 0.0–0.1)
Basophils Relative: 1.1 % (ref 0.0–3.0)
Eosinophils Absolute: 0.4 K/uL (ref 0.0–0.7)
Eosinophils Relative: 6.7 % — ABNORMAL HIGH (ref 0.0–5.0)
HCT: 39 % (ref 36.0–46.0)
Hemoglobin: 13.1 g/dL (ref 12.0–15.0)
Lymphocytes Relative: 29 % (ref 12.0–46.0)
Lymphs Abs: 1.8 K/uL (ref 0.7–4.0)
MCHC: 33.6 g/dL (ref 30.0–36.0)
MCV: 87.2 fl (ref 78.0–100.0)
Monocytes Absolute: 0.8 K/uL (ref 0.1–1.0)
Monocytes Relative: 11.9 % (ref 3.0–12.0)
Neutro Abs: 3.2 K/uL (ref 1.4–7.7)
Neutrophils Relative %: 51.3 % (ref 43.0–77.0)
Platelets: 218 K/uL (ref 150.0–400.0)
RBC: 4.47 Mil/uL (ref 3.87–5.11)
RDW: 15.6 % — ABNORMAL HIGH (ref 11.5–15.5)
WBC: 6.3 K/uL (ref 4.0–10.5)

## 2023-12-05 LAB — COMPREHENSIVE METABOLIC PANEL WITH GFR
ALT: 20 U/L (ref 0–35)
AST: 23 U/L (ref 0–37)
Albumin: 4.5 g/dL (ref 3.5–5.2)
Alkaline Phosphatase: 67 U/L (ref 39–117)
BUN: 16 mg/dL (ref 6–23)
CO2: 30 meq/L (ref 19–32)
Calcium: 9.8 mg/dL (ref 8.4–10.5)
Chloride: 101 meq/L (ref 96–112)
Creatinine, Ser: 1.15 mg/dL (ref 0.40–1.20)
GFR: 46.1 mL/min — ABNORMAL LOW (ref >60.00)
Glucose, Bld: 84 mg/dL (ref 70–99)
Potassium: 3.8 meq/L (ref 3.5–5.1)
Sodium: 140 meq/L (ref 135–145)
Total Bilirubin: 0.5 mg/dL (ref 0.2–1.2)
Total Protein: 6.7 g/dL (ref 6.0–8.3)

## 2023-12-05 LAB — VITAMIN D 25 HYDROXY (VIT D DEFICIENCY, FRACTURES): VITD: 51.38 ng/mL (ref 30.00–100.00)

## 2023-12-05 LAB — LIPID PANEL
Cholesterol: 184 mg/dL (ref 0–200)
HDL: 54.9 mg/dL (ref >39.00)
LDL Cholesterol: 92 mg/dL (ref 0–99)
NonHDL: 128.79
Total CHOL/HDL Ratio: 3
Triglycerides: 183 mg/dL — ABNORMAL HIGH (ref 0.0–149.0)
VLDL: 36.6 mg/dL (ref 0.0–40.0)

## 2023-12-05 LAB — TSH: TSH: 5.25 u[IU]/mL (ref 0.35–5.50)

## 2023-12-12 ENCOUNTER — Ambulatory Visit: Payer: Self-pay | Admitting: Nurse Practitioner

## 2023-12-16 NOTE — Telephone Encounter (Signed)
 Copied from CRM [id number]. Topic: General - Other >> Dec 16, 2023 11:14 AM Tammy Dalton wrote: Reason for CRM: Pt wanted to inform Leron Gretel PIETY, that she got the flu and Covid shot done on Friday. Pt also wanted to confirm her lab results, pt stated that she was told she would be starting on a medication or a therapy.

## 2023-12-23 ENCOUNTER — Encounter: Payer: Self-pay | Admitting: Nurse Practitioner

## 2023-12-23 NOTE — Assessment & Plan Note (Addendum)
 Chronic polyarthritis significantly affects her shoulders and fingers, causing muscle aches and stiffness. Prednisone  is effective for acute bursitis. Recommend over-the-counter Aleve as needed, particularly after physical activity. Monitor kidney function regularly.

## 2023-12-23 NOTE — Assessment & Plan Note (Signed)
 Blood pressure elevated x 2 readings today in office. Previously well controlled. Currently on lisinopril  and hydrochlorothiazide , 1.5 tablets daily with home blood pressure in the 120s. Continue lisinopril  and hydrochlorothiazide  at the current dose. Encouraged to continue home monitoring and contact if remaining consistently elevated.

## 2023-12-23 NOTE — Assessment & Plan Note (Signed)
 Hypothyroidism is managed with levothyroxine . Continue.

## 2023-12-23 NOTE — Assessment & Plan Note (Signed)
 Chronic sleep disorder is managed with Ambien , allowing her to achieve 6-7 hours of sleep per night without adverse effects. Continue Ambien  10 mg daily at bedtime. PDMP reviewed. Refills sent.

## 2023-12-23 NOTE — Assessment & Plan Note (Signed)
 Hyperlipidemia is managed with Crestor . Continue Crestor  as prescribed.

## 2023-12-25 ENCOUNTER — Telehealth: Payer: Self-pay

## 2023-12-25 NOTE — Telephone Encounter (Signed)
 Copied from CRM #[id number]. Topic: Appointments - Scheduling Inquiry for Clinic >> Dec 25, 2023 11:06 AM Timindy P wrote: Reason for CRM: patient is calling after receiving a letter from [facility] that Leron Glance is not in her network. They recommenced Dr. Tullo as she is in her network however she is listed as not taking new patients at this time. Patient is asking for a call back from the office to discuss further. She can be reached at Telephone Information: Mobile          [phone]

## 2023-12-27 ENCOUNTER — Encounter: Payer: Self-pay | Admitting: Pharmacist

## 2023-12-27 NOTE — Progress Notes (Signed)
 Pharmacy Quality Measure Review  This patient is appearing on a report for being at risk of failing the adherence measure for hypertension (ACEi/ARB) medications this calendar year.   Medication: lisinopril-hydrochlorothiazide 20/12.5 Last fill date: 09/09/23 for 90 day supply  Insurance report was not up to date. No action needed at this time.  Medication has been refilled as of 12/04/23 x90 ds. 2 additional refills remaining.  Refill due 12/9

## 2024-01-01 LAB — HM MAMMOGRAPHY

## 2024-01-24 ENCOUNTER — Other Ambulatory Visit: Payer: Self-pay | Admitting: Nurse Practitioner

## 2024-01-24 DIAGNOSIS — E039 Hypothyroidism, unspecified: Secondary | ICD-10-CM

## 2024-05-08 ENCOUNTER — Ambulatory Visit: Payer: Medicare Other

## 2024-06-03 ENCOUNTER — Ambulatory Visit: Admitting: Nurse Practitioner
